# Patient Record
Sex: Female | Born: 1980 | Race: White | Hispanic: No | Marital: Single | Smoking: Never smoker
Health system: Southern US, Community
[De-identification: ages and names within clinical notes are randomized; demographics above are authoritative.]

## PROBLEM LIST (undated history)

## (undated) DIAGNOSIS — F32A Depression, unspecified: Secondary | ICD-10-CM

## (undated) DIAGNOSIS — T4145XA Adverse effect of unspecified anesthetic, initial encounter: Secondary | ICD-10-CM

## (undated) DIAGNOSIS — E785 Hyperlipidemia, unspecified: Secondary | ICD-10-CM

## (undated) DIAGNOSIS — F419 Anxiety disorder, unspecified: Secondary | ICD-10-CM

## (undated) DIAGNOSIS — E039 Hypothyroidism, unspecified: Secondary | ICD-10-CM

## (undated) DIAGNOSIS — T8859XA Other complications of anesthesia, initial encounter: Secondary | ICD-10-CM

## (undated) DIAGNOSIS — F329 Major depressive disorder, single episode, unspecified: Secondary | ICD-10-CM

## (undated) HISTORY — PX: KNEE SURGERY: SHX244

## (undated) HISTORY — DX: Hyperlipidemia, unspecified: E78.5

---

## 2001-06-12 ENCOUNTER — Encounter: Payer: Self-pay | Admitting: Internal Medicine

## 2001-06-12 ENCOUNTER — Ambulatory Visit (HOSPITAL_COMMUNITY): Admission: RE | Admit: 2001-06-12 | Discharge: 2001-06-12 | Payer: Self-pay | Admitting: Internal Medicine

## 2001-06-13 ENCOUNTER — Other Ambulatory Visit: Admission: RE | Admit: 2001-06-13 | Discharge: 2001-06-13 | Payer: Self-pay | Admitting: Internal Medicine

## 2015-02-12 ENCOUNTER — Other Ambulatory Visit: Payer: Self-pay | Admitting: General Surgery

## 2015-03-30 ENCOUNTER — Encounter: Payer: BLUE CROSS/BLUE SHIELD | Attending: General Surgery | Admitting: Dietician

## 2015-03-30 ENCOUNTER — Encounter: Payer: Self-pay | Admitting: Dietician

## 2015-03-30 VITALS — Ht 67.0 in | Wt 258.9 lb

## 2015-03-30 DIAGNOSIS — Z713 Dietary counseling and surveillance: Secondary | ICD-10-CM | POA: Insufficient documentation

## 2015-03-30 DIAGNOSIS — E669 Obesity, unspecified: Secondary | ICD-10-CM | POA: Diagnosis not present

## 2015-03-30 DIAGNOSIS — Z6841 Body Mass Index (BMI) 40.0 and over, adult: Secondary | ICD-10-CM | POA: Diagnosis not present

## 2015-03-30 NOTE — Progress Notes (Signed)
  Pre-Op Assessment Visit:  Pre-Operative LAGB Surgery  Medical Nutrition Therapy:  Appt start time: 0800   End time:  0830.  Patient was seen on 03/30/2015 for Pre-Operative Nutrition Assessment. Assessment and [facility] on 03/30/2015.   Preferred Learning Style:   No preference indicated   Learning Readiness:   Ready  Handouts given during visit include:  Pre-Op Goals Bariatric Surgery Protein Shakes   During the appointment today the following Pre-Op Goals were reviewed with the patient: Maintain or lose weight as instructed by your surgeon Make healthy food choices Begin to limit portion sizes Limited concentrated sugars and fried foods Keep fat/sugar in the single digits per serving on   food labels Practice CHEWING your food  (aim for 30 chews per bite or until applesauce consistency) Practice not drinking 15 minutes before, during, and 30 minutes after each meal/snack Avoid all carbonated beverages  Avoid/limit caffeinated beverages  Avoid all sugar-sweetened beverages Consume 3 meals per day; eat every 3-5 hours Make a list of non-food related activities Aim for 64-100 ounces of FLUID daily  Aim for at least 60-80 grams of PROTEIN daily Look for a liquid protein source that contain ?15 g protein and ?5 g carbohydrate  (ex: shakes, drinks, shots)  Patient-Centered Goals: She would like to have to have more energy, improve sleep, and improve self confidence.  9-10 level of confidence/10 level of importance  Demonstrated degree of understanding via:  Teach Back  Teaching Method Utilized:  Visual Auditory Hands on  Barriers to learning/adherence to lifestyle change: none  Patient to call the [facility] to enroll in Pre-Op and Post-Op Nutrition Education when surgery date is scheduled.

## 2015-03-30 NOTE — Patient Instructions (Signed)

## 2015-05-22 ENCOUNTER — Ambulatory Visit: Payer: Self-pay | Admitting: Dietician

## 2015-08-02 NOTE — Progress Notes (Signed)
Please put orders in Epic surgery 08-17-15 pre op 08-10-15 Thanks

## 2015-08-05 NOTE — Patient Instructions (Signed)
Cynthia Webb  08/05/2015   Your procedure is scheduled on:   08/17/2015    Report to [facility]  Entrance take [place]  elevators to 3rd floor to  Short Stay Center at    0800 AM.  Call this number if you have problems the morning of surgery [phone]   Remember: ONLY 1 PERSON MAY GO WITH YOU TO SHORT STAY TO GET  READY MORNING OF YOUR SURGERY.  Do not eat food or drink liquids :After Midnight.     Take these medicines the morning of surgery with A SIP OF WATER: Thyroid ( Armour) DO NOT TAKE ANY DIABETIC MEDICATIONS DAY OF YOUR SURGERY                               You may not have any metal on your body including hair pins and              piercings  Do not wear jewelry, make-up, lotions, powders or perfumes, deodorant             Do not wear nail polish.  Do not shave  48 hours prior to surgery.                Do not bring valuables to the hospital. [facility] IS NOT             RESPONSIBLE   FOR VALUABLES.  Contacts, dentures or bridgework may not be worn into surgery.  Leave suitcase in the car. After surgery it may be brought to your room.       Special Instructions: coughing and deep breathing exercises, leg exercises               Please read over the following fact sheets you were given: _____________________________________________________________________             [facility] - Preparing for Surgery Before surgery, you can play an important role.  Because skin is not sterile, your skin needs to be as free of germs as possible.  You can reduce the number of germs on your skin by washing with CHG (chlorahexidine gluconate) soap before surgery.  CHG is an antiseptic cleaner which kills germs and bonds with the skin to continue killing germs even after washing. Please DO NOT use if you have an allergy to CHG or antibacterial soaps.  If your skin becomes reddened/irritated stop using the CHG and inform your nurse when you arrive at Short  Stay. Do not shave (including legs and underarms) for at least 48 hours prior to the first CHG shower.  You may shave your face/neck. Please follow these instructions carefully:  1.  Shower with CHG Soap the night before surgery and the  morning of Surgery.  2.  If you choose to wash your hair, wash your hair first as usual with your  normal  shampoo.  3.  After you shampoo, rinse your hair and body thoroughly to remove the  shampoo.                           4.  Use CHG as you would any other liquid soap.  You can apply chg directly  to the skin and wash  Gently with a scrungie or clean washcloth.  5.  Apply the CHG Soap to your body ONLY FROM THE NECK DOWN.   Do not use on face/ open                           Wound or open sores. Avoid contact with eyes, ears mouth and genitals (private parts).                       Wash face,  Genitals (private parts) with your normal soap.             6.  Wash thoroughly, paying special attention to the area where your surgery  will be performed.  7.  Thoroughly rinse your body with warm water from the neck down.  8.  DO NOT shower/wash with your normal soap after using and rinsing off  the CHG Soap.                9.  Pat yourself dry with a clean towel.            10.  Wear clean pajamas.            11.  Place clean sheets on your bed the night of your first shower and do not  sleep with pets. Day of Surgery : Do not apply any lotions/deodorants the morning of surgery.  Please wear clean clothes to the hospital/surgery center.  FAILURE TO FOLLOW THESE INSTRUCTIONS MAY RESULT IN THE CANCELLATION OF YOUR SURGERY PATIENT SIGNATURE_________________________________  NURSE SIGNATURE__________________________________  ________________________________________________________________________

## 2015-08-06 ENCOUNTER — Ambulatory Visit: Payer: Self-pay | Admitting: General Surgery

## 2015-08-09 ENCOUNTER — Encounter: Payer: BLUE CROSS/BLUE SHIELD | Attending: General Surgery

## 2015-08-09 DIAGNOSIS — Z713 Dietary counseling and surveillance: Secondary | ICD-10-CM | POA: Diagnosis not present

## 2015-08-09 DIAGNOSIS — Z6837 Body mass index (BMI) 37.0-37.9, adult: Secondary | ICD-10-CM | POA: Diagnosis not present

## 2015-08-10 ENCOUNTER — Encounter (HOSPITAL_COMMUNITY): Payer: Self-pay

## 2015-08-10 ENCOUNTER — Encounter (HOSPITAL_COMMUNITY)
Admission: RE | Admit: 2015-08-10 | Discharge: 2015-08-10 | Disposition: A | Discharge status: Home / Self Care | Payer: BLUE CROSS/BLUE SHIELD | Source: Ambulatory Visit | Attending: General Surgery | Admitting: General Surgery

## 2015-08-10 DIAGNOSIS — Z01818 Encounter for other preprocedural examination: Secondary | ICD-10-CM | POA: Insufficient documentation

## 2015-08-10 HISTORY — DX: Other complications of anesthesia, initial encounter: T88.59XA

## 2015-08-10 HISTORY — DX: Major depressive disorder, single episode, unspecified: F32.9

## 2015-08-10 HISTORY — DX: Hypothyroidism, unspecified: E03.9

## 2015-08-10 HISTORY — DX: Depression, unspecified: F32.A

## 2015-08-10 HISTORY — DX: Adverse effect of unspecified anesthetic, initial encounter: T41.45XA

## 2015-08-10 HISTORY — DX: Anxiety disorder, unspecified: F41.9

## 2015-08-10 LAB — COMPREHENSIVE METABOLIC PANEL
ALBUMIN: 3.8 g/dL (ref 3.5–5.0)
ALK PHOS: 67 U/L (ref 38–126)
ALT: 25 U/L (ref 14–54)
ANION GAP: 8 (ref 5–15)
AST: 21 U/L (ref 15–41)
BUN: 15 mg/dL (ref 6–20)
CHLORIDE: 107 mmol/L (ref 101–111)
CO2: 22 mmol/L (ref 22–32)
Calcium: 8.7 mg/dL — ABNORMAL LOW (ref 8.9–10.3)
Creatinine, Ser: 0.73 mg/dL (ref 0.44–1.00)
GFR calc non Af Amer: >60 mL/min (ref >60)
GLUCOSE: 83 mg/dL (ref 65–99)
POTASSIUM: 3.9 mmol/L (ref 3.5–5.1)
SODIUM: 137 mmol/L (ref 135–145)
Total Bilirubin: 0.4 mg/dL (ref 0.3–1.2)
Total Protein: 7.2 g/dL (ref 6.5–8.1)

## 2015-08-10 LAB — CBC WITH DIFFERENTIAL/PLATELET
BASOS PCT: 0 %
Basophils Absolute: 0 10*3/uL (ref 0.0–0.1)
EOS ABS: 0.1 10*3/uL (ref 0.0–0.7)
EOS PCT: 1 %
HCT: 40.6 % (ref 36.0–46.0)
HEMOGLOBIN: 13.2 g/dL (ref 12.0–15.0)
LYMPHS ABS: 3.2 10*3/uL (ref 0.7–4.0)
Lymphocytes Relative: 31 %
MCH: 29.6 pg (ref 26.0–34.0)
MCHC: 32.5 g/dL (ref 30.0–36.0)
MCV: 91 fL (ref 78.0–100.0)
MONOS PCT: 6 %
Monocytes Absolute: 0.6 10*3/uL (ref 0.1–1.0)
NEUTROS PCT: 62 %
Neutro Abs: 6.3 10*3/uL (ref 1.7–7.7)
PLATELETS: 300 10*3/uL (ref 150–400)
RBC: 4.46 MIL/uL (ref 3.87–5.11)
RDW: 14.4 % (ref 11.5–15.5)
WBC: 10.3 10*3/uL (ref 4.0–10.5)

## 2015-08-10 NOTE — Progress Notes (Signed)
03/30/15 EKG on chart

## 2015-08-11 DIAGNOSIS — L719 Rosacea, unspecified: Secondary | ICD-10-CM | POA: Insufficient documentation

## 2015-08-11 DIAGNOSIS — K219 Gastro-esophageal reflux disease without esophagitis: Secondary | ICD-10-CM

## 2015-08-11 DIAGNOSIS — I10 Essential (primary) hypertension: Secondary | ICD-10-CM | POA: Insufficient documentation

## 2015-08-11 DIAGNOSIS — J309 Allergic rhinitis, unspecified: Secondary | ICD-10-CM | POA: Insufficient documentation

## 2015-08-11 NOTE — Progress Notes (Signed)
  Pre-Operative Nutrition Class:  Appt start time: 3220   End time:  1830.  Patient was seen on 08/09/15 for Pre-Operative Bariatric Surgery Education at [facility].   Surgery date: 08/17/15 Surgery type: LAGB Start weight at [facility]: 259 lbs on 03/30/15 Weight today: 252.4 lbs  TANITA  BODY COMP RESULTS  08/09/15   BMI (kg/m^2) N/A   Fat Mass (lbs)    Fat Free Mass (lbs)    Total Body Water (lbs)     The following the learning objectives were met by the patient during this course:  Identify Pre-Op Dietary Goals and will begin 2 weeks pre-operatively  Identify appropriate sources of fluids and proteins   State protein recommendations and appropriate sources pre and post-operatively  Identify Post-Operative Dietary Goals and will follow for 2 weeks post-operatively  Identify appropriate multivitamin and calcium sources  Describe the need for physical activity post-operatively and will follow MD recommendations  State when to call healthcare provider regarding medication questions or post-operative complications  Handouts given during class include:  Pre-Op Bariatric Surgery Diet Handout  Protein Shake Handout  Post-Op Bariatric Surgery Nutrition Handout  [facility]EL[facility] Program Information Flyer  Support Group Information Flyer  [facility] Bariatric Supplements Price List  Follow-Up Plan: Patient will follow-up at [facility] 2 weeks post operatively for diet advancement per MD.

## 2015-08-12 ENCOUNTER — Ambulatory Visit: Payer: Self-pay | Admitting: General Surgery

## 2015-08-12 NOTE — H&P (Signed)
Cynthia Webb 08/12/2015 11:40 AM Location: [facility] Patient #: [id number] DOB: 11/03/1981 Single / Language: English / Race: White Female History of Present Illness (Eric M. Wilson MD; 08/12/2015 1:44 PM) The patient is a 34 year old female who presents for a preoperative evaluation. She comes in today for her preoperative visit. She has been scheduled and approved for laparoscopic adjustable gastric band surgery for October 4. I initially met her on April 1. Her weight at that time was 260 pounds. She denies any changes since she was last seen. She denies any trips to the emergency room or hospital. She denies any new medical diagnoses. She denies any chest pain, chest pressure, source of breath, dyspnea on exertion, orthopnea or paroxysmal nocturnal dyspnea. Her ultrasound, chest x-ray, upper GI were all normal. Her H. pylori breath test was within normal limits. The majority of her blood work was within acceptable limits however her lipid panel was elevated. Her total cholesterol level was 264, triglyceride level was 132, LDL level was 205, HDL level 33. Hemoglobin A1c was 5.5, vitamin D level was low at 28. 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  Diagnostic Studies History (Eric M Wilson, MD; 08/12/2015 1:46 PM) Colonoscopy never Mammogram 1-3 years ago Pap Smear 1-5 years ago  Allergies (Ashley Beck, CMA; 08/12/2015 11:41 AM) No Known Drug Allergies 02/12/2015  Medication History (Eric M Wilson, MD; 08/12/2015 1:46 PM) Armour  Thyroid (90MG Tablet, Oral) Active. Omeprazole (10MG Capsule DR, Oral) Active. Nasonex (50MCG/ACT Suspension, Nasal) Active. Medications Reconciled Ambien (5MG Tablet, 1 (one) Tablet Oral at bedtime, as needed, Taken starting 08/12/2015) Active. Zofran ODT (4MG Tablet Disperse, 1 (one) Tablet Disperse Oral every six hours, as needed, Taken starting 08/12/2015) Active. OxyCODONE HCl (5MG/5ML Solution, 5-10 Milliliter Oral every four hours, as needed, Taken starting 08/12/2015) Active.  Social History (Eric M Wilson, MD; 08/12/2015 1:46 PM) No drug use Tobacco use Never smoker. Alcohol use Occasional alcohol use. No caffeine use  Family History (Eric M Wilson, MD; 08/12/2015 1:46 PM) Depression Father, Mother. Heart disease in female family member before age 55 Hypertension Father, Mother. Melanoma Father. Thyroid problems Mother.  Pregnancy / Birth History (Eric M Wilson, MD; 08/12/2015 1:46 PM) Gravida 3 Maternal age 15-20 Para 0 Contraceptive History Intrauterine device. Age at menarche 11 years. Regular periods     Review of Systems (Eric M Wilson, MD; 08/12/2015 1:39 00) General Present- Fatigue. Not Present- Appetite Loss, Chills, Fever, Night Sweats, Weight Gain and Weight Loss. Skin Present- Dryness. Not Present- Change in Wart/Mole, Hives, Jaundice, New Lesions, Non-Healing Wounds, Rash and Ulcer. HEENT Present- Earache, Seasonal Allergies and Wears glasses/contact lenses. Not Present- Hearing Loss, Hoarseness, Nose Bleed, Oral Ulcers, Ringing in the Ears, Sinus Pain, Sore Throat, Visual Disturbances and Yellow Eyes. Respiratory Not Present- Bloody sputum, Chronic Cough, Difficulty Breathing, Snoring and Wheezing. Breast Not Present- Breast Mass, Breast Pain, Nipple Discharge and Skin Changes. Cardiovascular Not Present- Chest Pain, Difficulty Breathing Lying Down, Leg Cramps, Palpitations, Rapid Heart Rate, Shortness of Breath and Swelling of  Extremities. Gastrointestinal Present- Bloating. Not Present- Abdominal Pain, Bloody Stool, Change in Bowel Habits, Chronic diarrhea,   Constipation, Difficulty Swallowing, Excessive gas, Gets full quickly at meals, Hemorrhoids, Indigestion, Nausea, Rectal Pain and Vomiting. Female Genitourinary Not Present- Frequency, Nocturia, Painful Urination, Pelvic Pain and Urgency. Musculoskeletal Present- Back Pain, Joint Stiffness, Muscle Pain, Muscle Weakness and Swelling of Extremities. Not Present- Joint Pain. Neurological Present- Headaches. Not Present- Decreased Memory, Fainting, Numbness, Seizures, Tingling, Tremor, Trouble walking and Weakness. Psychiatric Present- Anxiety and Depression. Not Present- Bipolar, Change in Sleep Pattern, Fearful and Frequent crying. Endocrine Not Present- Cold Intolerance, Excessive Hunger, Hair Changes, Heat Intolerance, Hot flashes and New Diabetes. Hematology Not Present- Easy Bruising, Excessive bleeding, Gland problems, HIV and Persistent Infections.  Vitals (Ashley Beck CMA; 08/12/2015 11:41 AM) 08/12/2015 11:41 AM Weight: 251.4 lb Height: 67in Body Surface Area: 2.32 m Body Mass Index: 39.37 kg/m Temp.: 98.1F(Temporal)  Pulse: 81 (Regular)  BP: 128/82 (Sitting, Left Arm, Standard)     Physical Exam (Eric M. Wilson MD; 08/12/2015 1:38 PM)  General Mental Status-Alert. General Appearance-Consistent with stated age. Hydration-Well hydrated. Voice-Normal. Note: morbidly obese   Head and Neck Head-normocephalic, atraumatic with no lesions or palpable masses. Trachea-midline. Thyroid Gland Characteristics - normal size and consistency.  Eye Eyeball - Bilateral-Extraocular movements intact. Sclera/Conjunctiva - Bilateral-No scleral icterus.  Chest and Lung Exam Chest and lung exam reveals -quiet, even and easy respiratory effort with no use of accessory muscles and on auscultation, normal breath sounds, no  adventitious sounds and normal vocal resonance. Inspection Chest Wall - Normal. Back - normal.  Breast - Did not examine.  Cardiovascular Cardiovascular examination reveals -normal heart sounds, regular rate and rhythm with no murmurs and normal pedal pulses bilaterally.  Abdomen Inspection Inspection of the abdomen reveals - No Hernias. Skin - Scar - no surgical scars. Palpation/Percussion Palpation and Percussion of the abdomen reveal - Soft, Non Tender, No Rebound tenderness, No Rigidity (guarding) and No hepatosplenomegaly. Auscultation Auscultation of the abdomen reveals - Bowel sounds normal.  Peripheral Vascular Upper Extremity Palpation - Pulses bilaterally normal.  Neurologic Neurologic evaluation reveals -alert and oriented x 3 with no impairment of recent or remote memory. Mental Status-Normal.  Neuropsychiatric The patient's mood and affect are described as -normal. Judgment and Insight-insight is appropriate concerning matters relevant to self.  Musculoskeletal Normal Exam - Left-Upper Extremity Strength Normal and Lower Extremity Strength Normal. Normal Exam - Right-Upper Extremity Strength Normal and Lower Extremity Strength Normal.  Lymphatic Head & Neck  General Head & Neck Lymphatics: Bilateral - Description - Normal. Axillary - Did not examine. Femoral & Inguinal - Did not examine.    Assessment & Plan (Eric M. Wilson MD; 08/12/2015 1:46 PM)  BODY MASS INDEX (BMI) OF 40.0 TO 49.9 (E66.01) Impression: We reviewed her preoperative workup. We discussed the preoperative diet. We discussed the typical postoperative course including the diet progression after surgery. We discussed the typical follow-up schedule. We discussed the typical weight loss seen after lap band surgery. We discussed the expected eating tips and techniques she will need to develop in order to avoid complications after surgery.  Current Plans Pt Education -  EMW_preopbariatric Started Zofran ODT 4MG, 1 (one) Tablet Disperse every six hours, as needed, #20, 08/12/2015, No Refill. Started OxyCODONE HCl 5MG/5ML, 5-10 Milliliter every four hours, as needed, 120 Milliliter, 08/12/2015, No Refill. 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 She normally takes melatonin however anesthesia asked her to   stop taking that preoperatively. She inquires something to help her sleep at night for the next several days. She states that Tylenol and Advil PM do not help. I gave her a few tablets of Ambien.  Current Plans Started Ambien 5MG, 1 (one) Tablet at bedtime, as needed, #5, 08/12/2015, No Refill. VITAMIN D DEFICIENCY (E55.9) Impression: She knew she had a vitamin D deficiency. She states that she is taking a daily supplement. We will readdress this after surgery. She may have to go on a short course of 50,000 international units once per week for about 8 weeks  Eric M. Wilson, MD, FACS General, Bariatric, & Minimally Invasive Surgery [facility]  

## 2015-08-17 ENCOUNTER — Encounter (HOSPITAL_COMMUNITY): Payer: Self-pay | Admitting: *Deleted

## 2015-08-17 ENCOUNTER — Ambulatory Visit (HOSPITAL_COMMUNITY)
Admission: RE | Admit: 2015-08-17 | Discharge: 2015-08-17 | Disposition: A | Discharge status: Home / Self Care | Payer: BLUE CROSS/BLUE SHIELD | Source: Ambulatory Visit | Attending: General Surgery | Admitting: General Surgery

## 2015-08-17 ENCOUNTER — Encounter (HOSPITAL_COMMUNITY)
Admission: RE | Disposition: A | Discharge status: Home / Self Care | Payer: Self-pay | Source: Ambulatory Visit | Attending: General Surgery

## 2015-08-17 ENCOUNTER — Ambulatory Visit (HOSPITAL_COMMUNITY): Payer: BLUE CROSS/BLUE SHIELD | Admitting: Anesthesiology

## 2015-08-17 ENCOUNTER — Ambulatory Visit (HOSPITAL_COMMUNITY): Payer: BLUE CROSS/BLUE SHIELD

## 2015-08-17 DIAGNOSIS — E063 Autoimmune thyroiditis: Secondary | ICD-10-CM | POA: Diagnosis not present

## 2015-08-17 DIAGNOSIS — G478 Other sleep disorders: Secondary | ICD-10-CM | POA: Insufficient documentation

## 2015-08-17 DIAGNOSIS — E039 Hypothyroidism, unspecified: Secondary | ICD-10-CM | POA: Insufficient documentation

## 2015-08-17 DIAGNOSIS — E781 Pure hyperglyceridemia: Secondary | ICD-10-CM | POA: Diagnosis not present

## 2015-08-17 DIAGNOSIS — Z6837 Body mass index (BMI) 37.0-37.9, adult: Secondary | ICD-10-CM | POA: Insufficient documentation

## 2015-08-17 DIAGNOSIS — F419 Anxiety disorder, unspecified: Secondary | ICD-10-CM | POA: Insufficient documentation

## 2015-08-17 DIAGNOSIS — F329 Major depressive disorder, single episode, unspecified: Secondary | ICD-10-CM | POA: Diagnosis not present

## 2015-08-17 DIAGNOSIS — E7800 Pure hypercholesterolemia, unspecified: Secondary | ICD-10-CM | POA: Diagnosis not present

## 2015-08-17 DIAGNOSIS — K449 Diaphragmatic hernia without obstruction or gangrene: Secondary | ICD-10-CM | POA: Diagnosis not present

## 2015-08-17 DIAGNOSIS — I10 Essential (primary) hypertension: Secondary | ICD-10-CM | POA: Diagnosis not present

## 2015-08-17 DIAGNOSIS — E559 Vitamin D deficiency, unspecified: Secondary | ICD-10-CM | POA: Insufficient documentation

## 2015-08-17 DIAGNOSIS — K219 Gastro-esophageal reflux disease without esophagitis: Secondary | ICD-10-CM | POA: Diagnosis not present

## 2015-08-17 DIAGNOSIS — R110 Nausea: Secondary | ICD-10-CM

## 2015-08-17 HISTORY — PX: LAPAROSCOPIC GASTRIC BANDING WITH HIATAL HERNIA REPAIR: SHX6351

## 2015-08-17 LAB — PREGNANCY, URINE: Preg Test, Ur: NEGATIVE

## 2015-08-17 SURGERY — GASTRIC BANDING, LAPAROSCOPIC, WITH HIATAL HERNIA REPAIR
Anesthesia: General

## 2015-08-17 MED ORDER — HYDROMORPHONE HCL 1 MG/ML IJ SOLN
0.2500 mg | INTRAMUSCULAR | Status: DC | PRN
  Administered 2015-08-17: 0.25 mg via INTRAVENOUS

## 2015-08-17 MED ORDER — FENTANYL CITRATE (PF) 100 MCG/2ML IJ SOLN
INTRAMUSCULAR | Status: DC | PRN
  Administered 2015-08-17 (×2): 50 ug via INTRAVENOUS
  Administered 2015-08-17: 100 ug via INTRAVENOUS
  Administered 2015-08-17: 50 ug via INTRAVENOUS

## 2015-08-17 MED ORDER — BUPIVACAINE LIPOSOME 1.3 % IJ SUSP
20.0000 mL | Freq: Once | INTRAMUSCULAR | Status: AC
  Administered 2015-08-17: 20 mL
  Filled 2015-08-17: qty 20

## 2015-08-17 MED ORDER — CEFOTETAN DISODIUM-DEXTROSE 2-2.08 GM-% IV SOLR
2.0000 g | INTRAVENOUS | Status: AC
  Administered 2015-08-17: 2 g via INTRAVENOUS

## 2015-08-17 MED ORDER — SODIUM CHLORIDE 0.9 % IJ SOLN
INTRAMUSCULAR | Status: AC
  Filled 2015-08-17: qty 10

## 2015-08-17 MED ORDER — LACTATED RINGERS IV SOLN
INTRAVENOUS | Status: DC
  Administered 2015-08-17: 1000 mL via INTRAVENOUS

## 2015-08-17 MED ORDER — HYDROMORPHONE HCL 1 MG/ML IJ SOLN
INTRAMUSCULAR | Status: DC | PRN
  Administered 2015-08-17 (×3): 0.5 mg via INTRAVENOUS

## 2015-08-17 MED ORDER — DEXAMETHASONE SODIUM PHOSPHATE 10 MG/ML IJ SOLN
INTRAMUSCULAR | Status: DC | PRN
  Administered 2015-08-17: 10 mg via INTRAVENOUS

## 2015-08-17 MED ORDER — FENTANYL CITRATE (PF) 250 MCG/5ML IJ SOLN
INTRAMUSCULAR | Status: AC
  Filled 2015-08-17: qty 25

## 2015-08-17 MED ORDER — HYDROMORPHONE HCL 1 MG/ML IJ SOLN
INTRAMUSCULAR | Status: AC
  Administered 2015-08-17: 0.25 mg via INTRAVENOUS
  Filled 2015-08-17: qty 1

## 2015-08-17 MED ORDER — OXYCODONE HCL 5 MG/5ML PO SOLN
5.0000 mg | ORAL | Status: DC | PRN
Start: 2015-08-17 — End: 2020-02-10

## 2015-08-17 MED ORDER — NEOSTIGMINE METHYLSULFATE 10 MG/10ML IV SOLN
INTRAVENOUS | Status: DC | PRN
  Administered 2015-08-17: 4 mg via INTRAVENOUS

## 2015-08-17 MED ORDER — PREMIER PROTEIN SHAKE
2.0000 [oz_av] | Freq: Four times a day (QID) | ORAL | Status: DC
  Filled 2015-08-17: qty 325.31

## 2015-08-17 MED ORDER — ONDANSETRON HCL 4 MG/2ML IJ SOLN
INTRAMUSCULAR | Status: DC | PRN
  Administered 2015-08-17: 4 mg via INTRAVENOUS

## 2015-08-17 MED ORDER — PROPOFOL 10 MG/ML IV BOLUS
INTRAVENOUS | Status: DC | PRN
  Administered 2015-08-17: 180 mg via INTRAVENOUS

## 2015-08-17 MED ORDER — CHLORHEXIDINE GLUCONATE 4 % EX LIQD: 60.0000 mL | Freq: Once | CUTANEOUS | Status: DC

## 2015-08-17 MED ORDER — PROMETHAZINE HCL 25 MG/ML IJ SOLN
INTRAMUSCULAR | Status: AC
  Administered 2015-08-17: 6.25 mg via INTRAVENOUS
  Filled 2015-08-17: qty 1

## 2015-08-17 MED ORDER — MEPERIDINE HCL 50 MG/ML IJ SOLN: 6.2500 mg | INTRAMUSCULAR | Status: DC | PRN

## 2015-08-17 MED ORDER — KCL IN DEXTROSE-NACL 20-5-0.45 MEQ/L-%-% IV SOLN
INTRAVENOUS | Status: DC
  Administered 2015-08-17: 15:00:00 via INTRAVENOUS
  Filled 2015-08-17: qty 1000

## 2015-08-17 MED ORDER — SODIUM CHLORIDE 0.9 % IJ SOLN
INTRAMUSCULAR | Status: AC
  Filled 2015-08-17: qty 20

## 2015-08-17 MED ORDER — MIDAZOLAM HCL 2 MG/2ML IJ SOLN
INTRAMUSCULAR | Status: AC
  Filled 2015-08-17: qty 4

## 2015-08-17 MED ORDER — SODIUM CHLORIDE 0.9 % IJ SOLN
INTRAMUSCULAR | Status: DC | PRN
  Administered 2015-08-17: 20 mL via INTRAVENOUS

## 2015-08-17 MED ORDER — MORPHINE SULFATE (PF) 10 MG/ML IV SOLN: 2.0000 mg | INTRAVENOUS | Status: DC | PRN

## 2015-08-17 MED ORDER — PROMETHAZINE HCL 25 MG/ML IJ SOLN
INTRAMUSCULAR | Status: AC
  Filled 2015-08-17: qty 1

## 2015-08-17 MED ORDER — GLYCOPYRROLATE 0.2 MG/ML IJ SOLN
INTRAMUSCULAR | Status: DC | PRN
  Administered 2015-08-17: .6 mg via INTRAVENOUS

## 2015-08-17 MED ORDER — KETOROLAC TROMETHAMINE 30 MG/ML IJ SOLN
INTRAMUSCULAR | Status: DC | PRN
  Administered 2015-08-17: 30 mg via INTRAVENOUS

## 2015-08-17 MED ORDER — SODIUM CHLORIDE 0.9 % IJ SOLN
INTRAMUSCULAR | Status: DC | PRN
  Administered 2015-08-17: 50 mL via INTRAVENOUS

## 2015-08-17 MED ORDER — LIDOCAINE HCL (CARDIAC) 20 MG/ML IV SOLN
INTRAVENOUS | Status: DC | PRN
  Administered 2015-08-17: 50 mg via INTRAVENOUS

## 2015-08-17 MED ORDER — ACETAMINOPHEN 160 MG/5ML PO SOLN
325.0000 mg | ORAL | Status: DC | PRN
  Administered 2015-08-17: 650 mg via ORAL
  Filled 2015-08-17: qty 20.3

## 2015-08-17 MED ORDER — CEFOTETAN DISODIUM-DEXTROSE 2-2.08 GM-% IV SOLR
INTRAVENOUS | Status: AC
  Filled 2015-08-17: qty 50

## 2015-08-17 MED ORDER — GLYCOPYRROLATE 0.2 MG/ML IJ SOLN
INTRAMUSCULAR | Status: AC
  Filled 2015-08-17: qty 3

## 2015-08-17 MED ORDER — ROCURONIUM BROMIDE 100 MG/10ML IV SOLN
INTRAVENOUS | Status: DC | PRN
Start: 2015-08-17 — End: 2015-08-17
  Administered 2015-08-17: 10 mg via INTRAVENOUS
  Administered 2015-08-17: 40 mg via INTRAVENOUS
  Administered 2015-08-17 (×2): 10 mg via INTRAVENOUS

## 2015-08-17 MED ORDER — PROPOFOL 10 MG/ML IV BOLUS
INTRAVENOUS | Status: AC
  Filled 2015-08-17: qty 20

## 2015-08-17 MED ORDER — ACETAMINOPHEN 160 MG/5ML PO SOLN: 650.0000 mg | ORAL | Status: DC | PRN

## 2015-08-17 MED ORDER — SODIUM CHLORIDE 0.9 % IJ SOLN
INTRAMUSCULAR | Status: AC
  Filled 2015-08-17: qty 50

## 2015-08-17 MED ORDER — MIDAZOLAM HCL 5 MG/5ML IJ SOLN
INTRAMUSCULAR | Status: DC | PRN
  Administered 2015-08-17: 2 mg via INTRAVENOUS

## 2015-08-17 MED ORDER — LACTATED RINGERS IV SOLN: INTRAVENOUS | Status: DC

## 2015-08-17 MED ORDER — PROMETHAZINE HCL 25 MG/ML IJ SOLN
6.2500 mg | INTRAMUSCULAR | Status: AC | PRN
  Administered 2015-08-17 (×2): 6.25 mg via INTRAVENOUS

## 2015-08-17 MED ORDER — HEPARIN SODIUM (PORCINE) 5000 UNIT/ML IJ SOLN
5000.0000 [IU] | INTRAMUSCULAR | Status: AC
  Administered 2015-08-17: 5000 [IU] via SUBCUTANEOUS
  Filled 2015-08-17: qty 1

## 2015-08-17 MED ORDER — OXYCODONE HCL 5 MG/5ML PO SOLN: 5.0000 mg | ORAL | Status: DC | PRN

## 2015-08-17 MED ORDER — ONDANSETRON HCL 4 MG/2ML IJ SOLN: 4.0000 mg | INTRAMUSCULAR | Status: DC | PRN

## 2015-08-17 MED ORDER — DEXAMETHASONE SODIUM PHOSPHATE 10 MG/ML IJ SOLN
INTRAMUSCULAR | Status: AC
  Filled 2015-08-17: qty 1

## 2015-08-17 MED ORDER — HYDROMORPHONE HCL 2 MG/ML IJ SOLN
INTRAMUSCULAR | Status: AC
  Filled 2015-08-17: qty 1

## 2015-08-17 SURGICAL SUPPLY — 59 items
ADH SKN CLS APL DERMABOND .7 (GAUZE/BANDAGES/DRESSINGS) ×2
APL SKNCLS STERI-STRIP NONHPOA (GAUZE/BANDAGES/DRESSINGS)
BAND LAP VG SYSTEM W/TUBES (Band) ×1 IMPLANT
BENZOIN TINCTURE PRP APPL 2/3 (GAUZE/BANDAGES/DRESSINGS) IMPLANT
BLADE HEX COATED 2.75 (ELECTRODE) ×1 IMPLANT
BLADE SURG 15 STRL LF DISP TIS (BLADE) ×2 IMPLANT
BLADE SURG 15 STRL SS (BLADE) ×4
BLADE SURG SZ11 CARB STEEL (BLADE) ×4 IMPLANT
CHLORAPREP W/TINT 26ML (MISCELLANEOUS) ×7 IMPLANT
COVER SURGICAL LIGHT HANDLE (MISCELLANEOUS) ×4 IMPLANT
DECANTER SPIKE VIAL GLASS SM (MISCELLANEOUS) ×4 IMPLANT
DERMABOND ADVANCED (GAUZE/BANDAGES/DRESSINGS) ×2
DERMABOND ADVANCED .7 DNX12 (GAUZE/BANDAGES/DRESSINGS) ×1 IMPLANT
DEVICE SUT QUICK LOAD TK 5 (STAPLE) ×7 IMPLANT
DEVICE SUT TI-KNOT TK 5X26 (MISCELLANEOUS) ×3 IMPLANT
DEVICE SUTURE ENDOST 10MM (ENDOMECHANICALS) ×3 IMPLANT
DEVICE TI KNOT TK5 (MISCELLANEOUS) ×1
DISSECTOR BLUNT TIP ENDO 5MM (MISCELLANEOUS) IMPLANT
DRAPE CAMERA CLOSED 9X96 (DRAPES) ×4 IMPLANT
DRAPE UTILITY XL STRL (DRAPES) ×8 IMPLANT
ELECT PENCIL ROCKER SW 15FT (MISCELLANEOUS) ×4 IMPLANT
ELECT REM PT RETURN 9FT ADLT (ELECTROSURGICAL) ×4
ELECTRODE REM PT RTRN 9FT ADLT (ELECTROSURGICAL) ×2 IMPLANT
GLOVE BIO SURGEON STRL SZ7.5 (GLOVE) ×4 IMPLANT
GLOVE BIOGEL M STRL SZ7.5 (GLOVE) IMPLANT
GLOVE INDICATOR 8.0 STRL GRN (GLOVE) ×4 IMPLANT
GOWN STRL REUS W/TWL XL LVL3 (GOWN DISPOSABLE) ×12 IMPLANT
HOVERMATT SINGLE USE (MISCELLANEOUS) ×4 IMPLANT
KIT BASIN OR (CUSTOM PROCEDURE TRAY) ×4 IMPLANT
LAP BAND VG SYSTEM W/TUBES (Band) ×4 IMPLANT
LIQUID BAND (GAUZE/BANDAGES/DRESSINGS) IMPLANT
MESH HERNIA 1X4 RECT BARD (Mesh General) ×1 IMPLANT
MESH HERNIA BARD 1X4 (Mesh General) ×2 IMPLANT
NDL SPNL 22GX3.5 QUINCKE BK (NEEDLE) ×1 IMPLANT
NEEDLE SPNL 22GX3.5 QUINCKE BK (NEEDLE) ×4 IMPLANT
NS IRRIG 1000ML POUR BTL (IV SOLUTION) ×4 IMPLANT
PACK UNIVERSAL I (CUSTOM PROCEDURE TRAY) ×4 IMPLANT
QUICK LOAD TK 5 (STAPLE) ×2
SET IRRIG TUBING LAPAROSCOPIC (IRRIGATION / IRRIGATOR) IMPLANT
SHEARS HARMONIC ACE PLUS 36CM (ENDOMECHANICALS) IMPLANT
SLEEVE XCEL OPT CAN 5 100 (ENDOMECHANICALS) ×8 IMPLANT
SOLUTION ANTI FOG 6CC (MISCELLANEOUS) ×4 IMPLANT
SPONGE LAP 18X18 X RAY DECT (DISPOSABLE) ×4 IMPLANT
STAPLER VISISTAT 35W (STAPLE) IMPLANT
SUT ETHIBOND 2 0 SH (SUTURE) ×12
SUT ETHIBOND 2 0 SH 36X2 (SUTURE) ×6 IMPLANT
SUT MNCRL AB 4-0 PS2 18 (SUTURE) ×4 IMPLANT
SUT PROLENE 2 0 CT2 30 (SUTURE) ×4 IMPLANT
SUT SILK 0 (SUTURE) ×4
SUT SILK 0 30XBRD TIE 6 (SUTURE) ×2 IMPLANT
SUT SURGIDAC NAB ES-9 0 48 120 (SUTURE) ×6 IMPLANT
SUT VIC AB 2-0 SH 27 (SUTURE) ×4
SUT VIC AB 2-0 SH 27X BRD (SUTURE) ×2 IMPLANT
SYR 20CC LL (SYRINGE) ×8 IMPLANT
TOWEL OR 17X26 10 PK STRL BLUE (TOWEL DISPOSABLE) ×4 IMPLANT
TROCAR BLADELESS 15MM (ENDOMECHANICALS) ×4 IMPLANT
TROCAR BLADELESS OPT 5 100 (ENDOMECHANICALS) ×4 IMPLANT
TUBE CALIBRATION LAPBAND (TUBING) ×4 IMPLANT
TUBING INSUFFLATION 10FT LAP (TUBING) ×4 IMPLANT

## 2015-08-17 NOTE — Op Note (Signed)
Cynthia Webb [id number] 1981/08/21 08/17/2015  Laparoscopic Adjustable Gastric Band Placement with hiatal hernia repair Operative Note   Pre-operative Diagnosis: Obesity (BMI 37) Hiatal hernia GERD HTN Hypercholesterolemia Hypertriglyceridemia  Post-operative Diagnosis: same  Surgeon: Atilano Ina   Assistants: Luretha Murphy MD FACS  Anesthesia: General endotracheal anesthesia  ASA Class: 3   Anesthesia: General plus 70cc exparel  Indications:  Obesity with unresponsive to medical treatment.  Findings: AP- Standard band; +hiatal hernia   Procedure Details  The patient was seen in the Holding Room. The risks, benefits, complications, treatment options, and expected outcomes were discussed with the patient. The possibilities of reaction to medication, pulmonary aspiration, perforation of viscus, bleeding, recurrent infection, the need for additional procedures, failure to diagnose a condition, and creating a complication requiring transfusion or operation were discussed with the patient. The patient concurred with the proposed plan, giving informed consent.   The patient was taken to Operating Room # 4, at [facility] identified as [name] and the procedure verified as Laparoscopic Adjustable Gastric Band Placement with possible hiatal hernia repair. A Time Out was held and the above information confirmed.  Full general anesthesia was induced with orotracheal intubation.  The patient was prepped and draped in a supine position. Appropriate antibiotics were given intravenously.  A 1cm incision was made 2 fingerbreadths below the left subcostal margin . Opitview technique was used to gain entry to the abdominal cavity. A 5 mm blunt trocar was advanced under direct vision through the abdominal wall with a 0 degree scope. The abdomen was insufflated, the laparoscope introduced.  There were no untoward findings on diagnostic laparoscopy. Trocars were placed  under direct vision in the following fashion: a 5mm trocar in the lateral right upper quadrant, a 15mm trocar in the right upper quadrant, a 5mm trocar in the high epigastrium, and one 5mm trocar slightly above and to the left of the umbilicus. The patient was placed in reverse trendelenburg position.  The [facility] liver retractor was then placed through the high epigastric trocar site and positioned to hold the liver.   A calibration tube was passed down the oropharynx and into the stomach. 10cc of air was inflated into the balloon and then the calibration tube was gently pulled back toward the GE junction. There was no resistance at the GE junciton/hiatus. It slid back into the esophagus. This confirmed a hiatal hernia. Her preop UGI also showed a small hiatal hernia. There was no dimple.   The gastrohepatic ligament was incised with harmonic scalpel. The right crus was identified. We identified the crossing fat along the right crus. The adipose tissue just above this area was incised with hook cautery. I then bluntly dissected out this area and identified the left crus. There was evidence of a hiatal hernia. I then mobilized the esophagus. The left and right crus were further mobilized with blunt dissection. I was then able to reapproximate the left and right crus with 0 Ethibond using an Endostitch suture device and securing it with a titanium tyknot. I placed a second suture in a similar fashion. We then had the CRNA readvanced the calibration tubing back into the stomach. 10 mL of air was insufflated into the calibration tube balloon. The calibration tube was then gently pulled back and there was resistance at the GE junction. The tube did not slide back up into the esophagus.   The angle of His was identified and the left crus was dissected free.  Approximately 8cm below the angle  of His on the lesser curvature, passing through the pars flaccida and preserving the vagus nerve, a blunt instrument was  gently passed anterior to the right crus and behind the gastro-esophageal junction without difficulty. Care was taken to minimize posterior dissection in order to prevent a posterior slip.   A AP-Standard Lap Band had been introduced into the abdominal cavity through the 15mm trocar and carried around the gastro-esophageal junction and locked onto itself. Three interrupted 2.0 Ethibond sutures (each secured with a titanium tie knot) were used to imbricate the anterior stomach to itself over the band to prevent anterior slippage.   The bowel was examined and there were no obvious lesions. Hemostasis was verified. 70 cc exparel was then infiltrated around all trocar sites in the preperitoneal space. The liver retractor was removed under direct visualization. There was no evidence of liver injury. The tubing from the band was brought out via the right upper quadrant 15mm trocar site. All trocars were then removed under direct vision. The skin incision was lengthened and a subcutaneous space was made to accommodate the port. A 1 inch square of vicryl mesh was anchored to the base of the port with 4 sutures. The port was attached to the tubing and then placed in the subcutaneous pocket.  The redundant tubing was advanced back into the abdominal cavity. Inverted interrupted deep dermal sutures using a 2-0 vicryl were placed.   The wounds were heavily irrigated. The skin incisions were closed with 4-0 monocryl. Dermabond was applied.   Instrument, sponge, and needle counts were correct prior to wound closure and at the conclusion of the case.          Complications:  None; patient tolerated the procedure well.                Condition: stable  [name]. Andrey Campanile, MD, FACS General, Bariatric, & Minimally Invasive Surgery [facility] [place]

## 2015-08-17 NOTE — H&P (View-Only) (Signed)
Cynthia Webb 08/12/2015 11:40 AM Location: [facility] Patient #: [id number] DOB: 06-08-1981 Single / Language: Cleophus Molt / Race: White Female History of Present Illness Randall Hiss M. Wilson MD; 08/12/2015 1:44 PM) The patient is a 34 year old female who presents for a preoperative evaluation. She comes in today for her preoperative visit. She has been scheduled and approved for laparoscopic adjustable gastric band surgery for October 4. I initially met her on April 1. Her weight at that time was 260 pounds. She denies any changes since she was last seen. She denies any trips to the emergency room or hospital. She denies any new medical diagnoses. She denies any chest pain, chest pressure, source of breath, dyspnea on exertion, orthopnea or paroxysmal nocturnal dyspnea. Her ultrasound, chest x-ray, upper GI were all normal. Her H. pylori breath test was within normal limits. The majority of her blood work was within acceptable limits however her lipid panel was elevated. Her total cholesterol level was 264, triglyceride level was 132, LDL level was 205, HDL level 33. Hemoglobin A1c was 5.5, vitamin D level was low at 28. 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  Diagnostic Studies History Gayland Curry, MD; 08/12/2015 1:46 PM) Colonoscopy never Mammogram 1-3 years ago Pap Smear 1-5 years ago  Allergies Elbert Ewings, CMA; 08/12/2015 11:41 AM) No Known Drug Allergies 02/12/2015  Medication History Gayland Curry, MD; 08/12/2015 1:46 PM) Armour  Thyroid (90MG Tablet, Oral) Active. Omeprazole (10MG Capsule DR, Oral) Active. Nasonex (50MCG/ACT Suspension, Nasal) Active. Medications Reconciled Ambien (5MG Tablet, 1 (one) Tablet Oral at bedtime, as needed, Taken starting 08/12/2015) Active. Zofran ODT (4MG Tablet Disperse, 1 (one) Tablet Disperse Oral every six hours, as needed, Taken starting 08/12/2015) Active. OxyCODONE HCl (5MG/5ML Solution, 5-10 Milliliter Oral every four hours, as needed, Taken starting 08/12/2015) Active.  Social History Gayland Curry, MD; 08/12/2015 1:46 PM) No drug use Tobacco use Never smoker. Alcohol use Occasional alcohol use. No caffeine use  Family History Gayland Curry, MD; 08/12/2015 1:46 PM) Depression Father, Mother. Heart disease in female family member before age 29 Hypertension Father, Mother. Melanoma Father. Thyroid problems Mother.  Pregnancy / Birth History Gayland Curry, MD; 08/12/2015 1:46 PM) Durenda Age 3 Maternal age 1-20 Para 0 Contraceptive History Intrauterine device. Age at menarche 69 years. Regular periods     Review of Systems Gayland Curry, MD; 08/12/2015 1:39 00) General Present- Fatigue. Not Present- Appetite Loss, Chills, Fever, Night Sweats, Weight Gain and Weight Loss. Skin Present- Dryness. Not Present- Change in Wart/Mole, Hives, Jaundice, New Lesions, Non-Healing Wounds, Rash and Ulcer. HEENT Present- Earache, Seasonal Allergies and Wears glasses/contact lenses. Not Present- Hearing Loss, Hoarseness, Nose Bleed, Oral Ulcers, Ringing in the Ears, Sinus Pain, Sore Throat, Visual Disturbances and Yellow Eyes. Respiratory Not Present- Bloody sputum, Chronic Cough, Difficulty Breathing, Snoring and Wheezing. Breast Not Present- Breast Mass, Breast Pain, Nipple Discharge and Skin Changes. Cardiovascular Not Present- Chest Pain, Difficulty Breathing Lying Down, Leg Cramps, Palpitations, Rapid Heart Rate, Shortness of Breath and Swelling of  Extremities. Gastrointestinal Present- Bloating. Not Present- Abdominal Pain, Bloody Stool, Change in Bowel Habits, Chronic diarrhea,  Constipation, Difficulty Swallowing, Excessive gas, Gets full quickly at meals, Hemorrhoids, Indigestion, Nausea, Rectal Pain and Vomiting. Female Genitourinary Not Present- Frequency, Nocturia, Painful Urination, Pelvic Pain and Urgency. Musculoskeletal Present- Back Pain, Joint Stiffness, Muscle Pain, Muscle Weakness and Swelling of Extremities. Not Present- Joint Pain. Neurological Present- Headaches. Not Present- Decreased Memory, Fainting, Numbness, Seizures, Tingling, Tremor, Trouble walking and Weakness. Psychiatric Present- Anxiety and Depression. Not Present- Bipolar, Change in Sleep Pattern, Fearful and Frequent crying. Endocrine Not Present- Cold Intolerance, Excessive Hunger, Hair Changes, Heat Intolerance, Hot flashes and New Diabetes. Hematology Not Present- Easy Bruising, Excessive bleeding, Gland problems, HIV and Persistent Infections.  Vitals Elbert Ewings CMA; 08/12/2015 11:41 AM) 08/12/2015 11:41 AM Weight: 251.4 lb Height: 67in Body Surface Area: 2.32 m Body Mass Index: 39.37 kg/m Temp.: 98.31F(Temporal)  Pulse: 81 (Regular)  BP: 128/82 (Sitting, Left Arm, Standard)     Physical Exam Randall Hiss M. Wilson MD; 08/12/2015 1:38 PM)  General Mental Status-Alert. General Appearance-Consistent with stated age. Hydration-Well hydrated. Voice-Normal. Note: morbidly obese   Head and Neck Head-normocephalic, atraumatic with no lesions or palpable masses. Trachea-midline. Thyroid Gland Characteristics - normal size and consistency.  Eye Eyeball - Bilateral-Extraocular movements intact. Sclera/Conjunctiva - Bilateral-No scleral icterus.  Chest and Lung Exam Chest and lung exam reveals -quiet, even and easy respiratory effort with no use of accessory muscles and on auscultation, normal breath sounds, no  adventitious sounds and normal vocal resonance. Inspection Chest Wall - Normal. Back - normal.  Breast - Did not examine.  Cardiovascular Cardiovascular examination reveals -normal heart sounds, regular rate and rhythm with no murmurs and normal pedal pulses bilaterally.  Abdomen Inspection Inspection of the abdomen reveals - No Hernias. Skin - Scar - no surgical scars. Palpation/Percussion Palpation and Percussion of the abdomen reveal - Soft, Non Tender, No Rebound tenderness, No Rigidity (guarding) and No hepatosplenomegaly. Auscultation Auscultation of the abdomen reveals - Bowel sounds normal.  Peripheral Vascular Upper Extremity Palpation - Pulses bilaterally normal.  Neurologic Neurologic evaluation reveals -alert and oriented x 3 with no impairment of recent or remote memory. Mental Status-Normal.  Neuropsychiatric The patient's mood and affect are described as -normal. Judgment and Insight-insight is appropriate concerning matters relevant to self.  Musculoskeletal Normal Exam - Left-Upper Extremity Strength Normal and Lower Extremity Strength Normal. Normal Exam - Right-Upper Extremity Strength Normal and Lower Extremity Strength Normal.  Lymphatic Head & Neck  General Head & Neck Lymphatics: Bilateral - Description - Normal. Axillary - Did not examine. Femoral & Inguinal - Did not examine.    Assessment & Plan Randall Hiss M. Wilson MD; 08/12/2015 1:46 PM)  BODY MASS INDEX (BMI) OF 40.0 TO 49.9 (E66.01) Impression: We reviewed her preoperative workup. We discussed the preoperative diet. We discussed the typical postoperative course including the diet progression after surgery. We discussed the typical follow-up schedule. We discussed the typical weight loss seen after lap band surgery. We discussed the expected eating tips and techniques she will need to develop in order to avoid complications after surgery.  Current Plans Pt Education -  EMW_preopbariatric Started Zofran ODT 4MG, 1 (one) Tablet Disperse every six hours, as needed, #20, 08/12/2015, No Refill. Started OxyCODONE HCl 5MG/5ML, 5-10 Milliliter every four hours, as needed, 120 Milliliter, 08/12/2015, No Refill. 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 She normally takes melatonin however anesthesia asked her to  stop taking that preoperatively. She inquires something to help her sleep at night for the next several days. She states that Tylenol and Advil PM do not help. I gave her a few tablets of Ambien.  Current Plans Started Ambien 5MG, 1 (one) Tablet at bedtime, as needed, #5, 08/12/2015, No Refill. VITAMIN D DEFICIENCY (E55.9) Impression: She knew she had a vitamin D deficiency. She states that she is taking a daily supplement. We will readdress this after surgery. She may have to go on a short course of 50,000 international units once per week for about 8 weeks  Eric M. Redmond Pulling, MD, FACS General, Bariatric, & Minimally Invasive Surgery [facility], [place]

## 2015-08-17 NOTE — Interval H&P Note (Signed)
History and Physical Interval Note:  08/17/2015 9:56 AM  Cynthia Webb  has presented today for surgery, with the diagnosis of Morbid Obesity  The various methods of treatment have been discussed with the patient and family. After consideration of risks, benefits and other options for treatment, the patient has consented to  Procedure(s): LAPAROSCOPIC GASTRIC BANDING (N/A) with possible hiatal hernia as a surgical intervention .  The patient's history has been reviewed, patient examined, no change in status, stable for surgery.  I have reviewed the patient's chart and labs.  Questions were answered to the patient's satisfaction.    [name]. Andrey Campanile, MD, FACS General, Bariatric, & Minimally Invasive Surgery [facility], [place]   [facility]

## 2015-08-17 NOTE — Transfer of Care (Signed)
Immediate Anesthesia Transfer of Care Note  Patient: Cynthia Webb  Procedure(s) Performed: Procedure(s): LAPAROSCOPIC GASTRIC BANDING WITH HIATAL HERNIA REPAIR  Patient Location: PACU  Anesthesia Type:General  Level of Consciousness:  sedated, patient cooperative and responds to stimulation  Airway & Oxygen Therapy:Patient Spontanous Breathing and Patient connected to face mask oxgen  Post-op Assessment:  Report given to PACU RN and Post -op Vital signs reviewed and stable  Post vital signs:  Reviewed and stable  Last Vitals:  Filed Vitals:   08/17/15 1151  BP: 135/74  Pulse: 109  Temp: 37 C  Resp: 18    Complications: No apparent anesthesia complications

## 2015-08-17 NOTE — Anesthesia Preprocedure Evaluation (Addendum)
Anesthesia Evaluation  Patient identified by MRN, date of birth, ID band Patient awake    Reviewed: Allergy & Precautions, NPO status , Patient's Chart, lab work & pertinent test results  Airway Mallampati: III  TM Distance: >3 FB Neck ROM: Full    Dental  (+) Teeth Intact   Pulmonary    breath sounds clear to auscultation       Cardiovascular hypertension,  Rhythm:Regular Rate:Normal     Neuro/Psych PSYCHIATRIC DISORDERS Anxiety Depression    GI/Hepatic Neg liver ROS, GERD  Medicated,  Endo/Other  Hypothyroidism   Renal/GU negative Renal ROS  negative genitourinary   Musculoskeletal negative musculoskeletal ROS (+)   Abdominal   Peds negative pediatric ROS (+)  Hematology negative hematology ROS (+)   Anesthesia Other Findings   Reproductive/Obstetrics negative OB ROS                            Lab Results  Component Value Date   WBC 10.3 08/10/2015   HGB 13.2 08/10/2015   HCT 40.6 08/10/2015   MCV 91.0 08/10/2015   PLT 300 08/10/2015   Lab Results  Component Value Date   CREATININE 0.73 08/10/2015   BUN 15 08/10/2015   NA 137 08/10/2015   K 3.9 08/10/2015   CL 107 08/10/2015   CO2 22 08/10/2015     Anesthesia Physical Anesthesia Plan  ASA: III  Anesthesia Plan: General   Post-op Pain Management:    Induction: Intravenous  Airway Management Planned: Oral ETT  Additional Equipment:   Intra-op Plan:   Post-operative Plan: Extubation in OR  Informed Consent: I have reviewed the patients History and Physical, chart, labs and discussed the procedure including the risks, benefits and alternatives for the proposed anesthesia with the patient or authorized representative who has indicated his/her understanding and acceptance.   Dental advisory given  Plan Discussed with: CRNA  Anesthesia Plan Comments:         Anesthesia Quick Evaluation

## 2015-08-17 NOTE — Progress Notes (Addendum)
Up to bathroom to void. Voided amber colored urine. Ambulated in hallway. Up to recliner chair with PAS intact.

## 2015-08-17 NOTE — Anesthesia Procedure Notes (Addendum)
Procedure Name: Intubation Date/Time: 08/17/2015 10:17 AM Performed by: Paris Lore Pre-anesthesia Checklist: Patient identified, Emergency Drugs available, Suction available, Patient being monitored and Timeout performed Patient Re-evaluated:Patient Re-evaluated prior to inductionOxygen Delivery Method: Circle system utilized Preoxygenation: Pre-oxygenation with 100% oxygen Intubation Type: IV induction Ventilation: Mask ventilation without difficulty Laryngoscope Size: Mac and 4 Grade View: Grade I Tube type: Oral Tube size: 7.5 mm Number of attempts: 1 Airway Equipment and Method: Stylet Placement Confirmation: ETT inserted through vocal cords under direct vision,  positive ETCO2,  CO2 detector and breath sounds checked- equal and bilateral Secured at: 21 cm Tube secured with: Tape Dental Injury: Teeth and Oropharynx as per pre-operative assessment

## 2015-08-17 NOTE — Progress Notes (Signed)
Patient is alert and oriented.  Pain is controlled, and patient is tolerating fluids.  Plan to advance to protein shake tomorrow.  Reviewed Adjustable gastric band discharge instructions with patient, patient able to articulate understanding.  Provided information on BELT program, Support Group and [facility] outpatient pharmacy. All questions answered, will continue to monitor.  

## 2015-08-17 NOTE — Anesthesia Postprocedure Evaluation (Signed)
  Anesthesia Post-op Note  Patient: Cynthia Webb  Procedure(s) Performed: Procedure(s): LAPAROSCOPIC GASTRIC BANDING WITH HIATAL HERNIA REPAIR  Patient Location: PACU  Anesthesia Type:General  Level of Consciousness: awake, alert  and oriented  Airway and Oxygen Therapy: Patient Spontanous Breathing  Post-op Pain: mild  Post-op Assessment: Post-op Vital signs reviewed and Patient's Cardiovascular Status Stable              Post-op Vital Signs: Reviewed and stable  Last Vitals:  Filed Vitals:   08/17/15 1300  BP: 121/81  Pulse: 69  Temp: 37.1 C  Resp: 18    Complications: No apparent anesthesia complications

## 2015-08-17 NOTE — Discharge Instructions (Signed)
General Anesthesia, Care After Refer to this sheet in the next few weeks. These instructions provide you with information on caring for yourself after your procedure. Your health care provider may also give you more specific instructions. Your treatment has been planned according to current medical practices, but problems sometimes occur. Call your health care provider if you have any problems or questions after your procedure. WHAT TO EXPECT AFTER THE PROCEDURE After the procedure, it is typical to experience:  Sleepiness.  Nausea and vomiting. HOME CARE INSTRUCTIONS  For the first 24 hours after general anesthesia:  Have a responsible person with you.  Do not drive a car. If you are alone, do not take public transportation.  Do not drink alcohol.  Do not take medicine that has not been prescribed by your health care provider.  Do not sign important papers or make important decisions.  You may resume a normal diet and activities as directed by your health care provider.  Change bandages (dressings) as directed.  If you have questions or problems that seem related to general anesthesia, call the hospital and ask for the anesthetist or anesthesiologist on call. SEEK MEDICAL CARE IF:  You have nausea and vomiting that continue the day after anesthesia.  You develop a rash. SEEK IMMEDIATE MEDICAL CARE IF:   You have difficulty breathing.  You have chest pain.  You have any allergic problems. Document Released: 02/05/2001 Document Revised: 11/04/2013 Document Reviewed: 05/15/2013 [facility] Patient Information 2015 [place], [place]. This information is not intended to replace advice given to you by your health care provider. Make sure you discuss any questions you have with your health care provider.                    ADJUSTABLE GASTRIC BAND  Home Care Instructions   These instructions are to help you care for yourself when you go home.  Call: If you have any  problems.  Call [phone] and ask for the surgeon on call  If you need immediate assistance come to the ER at [facility]. Tell the ER staff you are a new post-op gastric banding patient  Signs and symptoms to report:  Severe  vomiting or nausea o If you cannot handle clear liquids for longer than 1 day, call your surgeon  Abdominal pain which does not get better after taking your pain medication  Fever greater than 100.4  F and chills  Heart rate over 100 beats a minute  Trouble breathing  Chest pain  Redness,  swelling, drainage, or foul odor at incision (surgical) sites  If your incisions open or pull apart  Swelling or pain in calf (lower leg)  Diarrhea (Loose bowel movements that happen often), frequent watery, uncontrolled bowel movements  Constipation, (no bowel movements for 3 days) if this happens: o Take Milk of Magnesia, 2 tablespoons by mouth, 3 times a day for 2 days if needed o Stop taking Milk of Magnesia once you have had a bowel movement o Call your doctor if constipation continues Or o Take Miralax  (instead of Milk of Magnesia) following the label instructions o Stop taking Miralax once you have had a bowel movement o Call your doctor if constipation continues  Anything you think is abnormal for you   Normal side effects after surgery:  Unable to sleep at night or unable to concentrate  Irritability  Being tearful (crying) or depressed  These are common complaints, possibly related to your anesthesia, stress of surgery, and  change in lifestyle, that usually go away a few weeks after surgery. If these feelings continue, call your medical doctor.  Wound Care: You may have surgical glue, steri-strips, or staples over your incisions after surgery  Surgical glue: Looks like clear film over your incisions and will wear off a little at a time  Steri-strips: Adhesive strips of tape over your incisions. You may notice a yellowish color on skin under  the steri-strips. This is used to make the steri-strips stick better. Do not pull the steri-strips off - let them fall off  Staples: Staples may be removed before you leave the hospital o If you go home with staples, call [facility] for an appointment with your surgeons nurse to have staples removed 10 days after surgery, [phone]  Showering: You may shower two (2) days after your surgery unless your surgeon tells you differently o Wash gently around incisions with warm soapy water, rinse well, and gently pat dry o If you have a drain (tube from your incision), you may need someone to hold this while you shower o No tub baths until staples are removed and incisions are healed   Medications:  Medications should be liquid or crushed if larger than the size of a dime  Extended release pills (medication that releases a little bit at a time through the  day) should not be crushed  Depending on the size and number of medications you take, you may need to space (take a few throughout the day)/change the time you take your medications so that you do not over-fill your pouch (smaller stomach)  Make sure you follow-up with you primary care physician to make medication changes needed during rapid weight loss and life -style changes  If you have diabetes, follow up with your doctor that orders your diabetes medication(s) within one week after surgery and check your blood sugar regularly   Do not drive while taking narcotics (pain medications)   Do not take acetaminophen (Tylenol) and Roxicet or Lortab Elixir at the same time since these pain medications contain acetaminophen   Diet:  First 2 Weeks You will see the nutritionist about two (2) weeks after your surgery. The nutritionist will increase the types of foods you can eat if you are handling liquids well:  If you have severe vomiting or nausea and cannot handle clear liquids lasting longer than 1 day call your surgeon For  Same Day Surgery Discharge Patients:  The day of surgery drink water only: 2 ounces every 4 hours  If you are handling water, start drinking your high protein shake the next morning For Overnight Stay Patients:  Begin by drinking 2 ounces of a high protein every 3 hours, 5-6 times per day  Slowly increase the amount you drink as tolerated  You may find it easier to slowly sip shakes throughout the day. It is important to get your proteins in first    Protein Shake  Drink at least 2 ounces of shake 5-6 times per day  Each serving of protein shakes (usually 8-12 ounces) should have a minimum of: o 15 grams of protein o And no more than 5 grams of carbohydrate  Goal for protein each day: o Men = 80 grams per day o Women = 60 grams per day  Protein powder may be added to fluids such as non-fat milk or Lactaid milk or Soy milk (limit to 35 grams added protein powder per serving)  Hydration  Slowly increase the amount  of water and other clear liquids as tolerated (See Acceptable Fluids)  Slowly increase the amount of protein shake as tolerated  Sip fluids slowly and throughout the day  May use sugar substitutes in small amounts (no more than 6-8 packets per day; i.e. Splenda)  Fluid Goal  The first goal is to drink at least 8 ounces of protein shake/drink per day (or as directed by the nutritionist); some examples of protein shakes are Syntrax, Nectar, Dillard's, EAS Edge HP, and Unjury. - See handout from pre-op Bariatric Education Class: o Slowly increase the amount of protein shake you drink as tolerated o You may find it easier to slowly sip shakes throughout the day o It is important to get your proteins in first  Your fluid goal is to drink 64-100 ounces of fluid daily o It may take a few weeks to build up to this   32 oz. (or more) should be full liquids (see below for examples)  Liquids should not contain sugar, caffeine, or carbonation  Clear  Liquids:  Water of Sugar-free flavored water (i.e. Fruit HO, Propel)  Decaffeinated coffee or tea (sugar-free)  Crystal lite, Wylers Lite, Minute Maid Lite  Sugar-free Jell-O  Bouillon or broth  Sugar-free Popsicle:    - Less than 20 calories each; Limit 1 per day        Full Liquids:                   Protein Shakes/Drinks + 2 choices per day of other full liquids  Full liquids must be: o No More Than 12 grams of Carbs per serving o No More Than 3 grams of Fat per serving  Strained low-fat cream soup  Non-Fat milk  Fat-free Lactaid Milk  Sugar-free yogurt (Dannon Lite & Fit, Greek yogurt)   Vitamins and Minerals  Start 1 day after surgery unless otherwise directed by your surgeon  1 Chewable Multivitamin / Multimineral Supplement with iron (i.e. Centrum for Adults)  Chewable Calcium Citrate with Vitamin D-3 (Example: 3 Chewable Calcium  Plus 600 with vitamin D-3) o Take 500 mg three (3) times a day for a total of 1500 mg per day o Do not take all 3 doses of calcium at one time as it may cause constipation, and you can only absorb 500 mg at a time o Do not mix multivitamins containing iron with calcium supplements;  take 2 hours apart o Do not substitute Tums (calcium carbonate) for your calcium  Menstruating women and those at risk for anemia ( a blood disease that causes weakness) may need extra iron o Talk to your doctor to see if you need more iron  If you need extra iron: total daily iron recommendation (including Vitamins) is 50 to 100 mg Iron/day  Do not stop taking or change any vitamins or minerals until you talk to your nutritionist or surgeon  Your nutritionist and/or surgeon must approve all vitamin and mineral supplements  Activity and Exercise: It is important to continue walking at home. Limit your physical activity as instructed by your doctor. During this time, use these guidelines:  Do not lift anything greater than ten  (10) pounds for at  least two (2) weeks  Do not go back to work or drive until Designer, industrial/product says you can  You may have sex when you feel comfortable o It is VERY important for female patients to use a reliable birth control method; fertility often increase after surgery o Do not  get pregnant for at least 18 months  Start exercising as soon as your doctor tells you that you can o Make sure your doctor approves any physical activity  Start with a simple walking program  Walk 5-15 minutes each day, 7 days per week  Slowly increase until you are walking 30-45 minutes per day  Consider joining our BELT program. [phone] or email [email or web address]    Special Instructions  Things to remember:  Free counseling is available for you and your family through collaboration between [facility] and [place]. Please call [phone] and leave a message  Use your CPAP when sleeping if this applies to you  Consider buying a medical alert bracelet that says you had lap-band surgery  You will likely have your first fill (fluid added to your band) 6 - 8 weeks after surgery  [facility] has a free Bariatric Surgery Support Group that meets monthly, the 3rd Thursday, 6pm. Calvert Cantor. You can see classes online at [email or web address]  It is very important to keep all follow up appointments with your surgeon, nutritionist, primary care physician, and behavioral health practitioner o After the first year, please follow up with your bariatric surgeon and nutritionist at least once a year in order to maintain best weight loss results                    [facility]:  [phone]               [facility]: [phone]               Bariatric Nurse Coordinator: [phone]      Adjustable Gastric Band Home Care Instructions  Rev. 12/2012                                                                  Reviewed and  Endorsed                                                   by [facility] Patient Education Committee, Jan, 2014

## 2015-08-18 ENCOUNTER — Encounter (HOSPITAL_COMMUNITY): Payer: Self-pay | Admitting: General Surgery

## 2015-08-23 ENCOUNTER — Telehealth (HOSPITAL_COMMUNITY): Payer: Self-pay

## 2015-08-23 NOTE — Telephone Encounter (Addendum)
Attempted DROP discharge call, no answer, left message to return call  Made discharge phone call to patient per DROP protocol. Asking the following questions.    1. Do you have someone to care for you now that you are home?  yes 2. Are you having pain now that is not relieved by your pain medication?  no 3. Are you able to drink the recommended daily amount of fluids (48 ounces minimum/day) and protein (60-80 grams/day) as prescribed by the dietitian or nutritional counselor?  yes 4. Are you taking the vitamins and minerals as prescribed? yes  5. Do you have the "on call" number to contact your surgeon if you have a problem or question?  yes 6. Are your incisions free of redness, swelling or drainage? (If steri strips, address that these can fall off, shower as tolerated) yes 7. Have your bowels moved since your surgery?  If not, are you passing gas?  yes 8. Are you up and walking 3-4 times per day?  yes     

## 2015-08-26 ENCOUNTER — Encounter (HOSPITAL_COMMUNITY): Payer: Self-pay | Admitting: General Surgery

## 2015-08-31 ENCOUNTER — Encounter: Payer: BLUE CROSS/BLUE SHIELD | Attending: General Surgery

## 2015-08-31 DIAGNOSIS — Z6837 Body mass index (BMI) 37.0-37.9, adult: Secondary | ICD-10-CM | POA: Insufficient documentation

## 2015-08-31 DIAGNOSIS — Z713 Dietary counseling and surveillance: Secondary | ICD-10-CM | POA: Diagnosis not present

## 2015-08-31 NOTE — Progress Notes (Signed)
Bariatric Class:  Appt start time: 1530 end time:  1630.  2 Week Post-Operative Nutrition Class  Patient was seen on 08/31/15 for Post-Operative Nutrition education [facility].   Surgery date: 08/17/15 Surgery type: LAGB Start weight at [facility]: 259 lbs on 03/30/15 Weight today: 241.0 lbs lbs  Weight change: 11.4 lbs  TANITA  BODY COMP RESULTS  08/09/15 08/31/15   BMI (kg/m^2) N/A 36.6   Fat Mass (lbs)  115.5   Fat Free Mass (lbs)  125.5   Total Body Water (lbs)  92.0    The following the learning objectives were met by the patient during this course:  Identifies Phase 3A (Soft, High Proteins) Dietary Goals and will begin from 2 weeks post-operatively to 2 months post-operatively  Identifies appropriate sources of fluids and proteins   States protein recommendations and appropriate sources post-operatively  Identifies the need for appropriate texture modifications, mastication, and bite sizes when consuming solids  Identifies appropriate multivitamin and calcium sources post-operatively  Describes the need for physical activity post-operatively and will follow MD recommendations  States when to call healthcare provider regarding medication questions or post-operative complications  Handouts given during class include:  Phase 3A: Soft, High Protein Diet Handout  Follow-Up Plan: Patient will follow-up at [facility] in 6 weeks for 2 month post-op nutrition visit for diet advancement per MD.

## 2015-10-05 ENCOUNTER — Encounter: Payer: BLUE CROSS/BLUE SHIELD | Attending: General Surgery | Admitting: Dietician

## 2015-10-05 ENCOUNTER — Encounter: Payer: Self-pay | Admitting: Dietician

## 2015-10-05 DIAGNOSIS — Z713 Dietary counseling and surveillance: Secondary | ICD-10-CM | POA: Diagnosis not present

## 2015-10-05 DIAGNOSIS — Z6837 Body mass index (BMI) 37.0-37.9, adult: Secondary | ICD-10-CM | POA: Insufficient documentation

## 2015-10-05 NOTE — Patient Instructions (Addendum)
Goals:  Follow Phase 3B: High Protein + Non-Starchy Vegetables  Eat 3-6 small meals/snacks, every 3-5 hrs  Increase lean protein foods to meet 60g goal  Increase fluid intake to 64oz +  Avoid drinking 15 minutes before, during and 30 minutes after eating  Aim for >30 min of physical activity daily  Surgery date: 08/17/15 Surgery type: LAGB [facility]: [facility] lbs on 03/30/15 Weight today: 233 lbs   Weight change: 8 lbs Total weight loss: 26 lbs  TANITA  BODY COMP RESULTS  08/09/15 08/31/15 10/05/15   BMI (kg/m^2) N/A 36.6 35.4   Fat Mass (lbs)  115.5 110.5   Fat Free Mass (lbs)  125.5 122.5   Total Body Water (lbs)  92.0 89.5

## 2015-10-05 NOTE — Progress Notes (Signed)
  Follow-up visit:  6  Weeks Post-Operative LAGB Surgery  Medical Nutrition Therapy:  Appt start time: 1645 end time:  1715.  Primary concerns today: Post-operative Bariatric Surgery Nutrition Management. Returns with an 8 lb weight loss. Had a fill last week. Does not feel any difference in restriction since fill. Feels like she can eat all foods except for bread. Eating less some days and more other days. Feeling more hungry now than before surgery and hunger can come on quickly. Sometimes has protein shake at bedtime to avoid waking up hungry at night.   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   Surgery date: 08/17/15 Surgery type: [facility]AGB [facility] lbs on 03/30/15 Weight today: 233 lbs   Weight change: 8 lbs Total weight loss: 26 lbs  TANITA  BODY COMP RESULTS  08/09/15 08/31/15 10/05/15   BMI (kg/m^2) N/A 36.6 35.4   Fat Mass (lbs)  115.5 110.5   Fat Free Mass (lbs)  125.5 122.5   Total Body Water (lbs)  92.0 89.5    Preferred Learning Style:   No preference indicated   Learning Readiness:   Ready  24-hr recall: B (AM): Premier Protein Shake (30 g) Snk (AM): cheese, nuts, and fruit S[place]ento brand or P3 (12-15 g)  L (PM): 3-4 oz chicken/beef or Protein shake (21-28 g) Snk (PM): beef jerky or S[place]ento nuts, cheese, fruit (12-15 g) D (PM): 4-5 oz meat with spinach salad (28-35 g) Snk (PM): sometimes Protein Shake  Fluid intake: 11-22 oz protein shake, 60-64 oz water, 20 oz propel Estimated total protein intake: 100+ g  Medications: see list Supplementation: taking  Using straws: No Drinking while eating: No, tries to wait 30 minutes before drinking after meals Hair loss: No Carbonated beverages: No N/V/D/C: No, though has a bowel movement every day which is new for her Dumping syndrome: No Last Lap-Band fill: 01 cc fill week of 09/27/2015, total of 4 cc  Recent physical activity:  Walking 4-5 laps every 1-2 hours while  at work   Progress Towards Goal(s):  In progress.  Handouts given during visit include:  Phase 3B High Protein + Non Starchy Vegetables   Nutritional Diagnosis:  -3.3 Overweight/obesity related to past poor dietary habits and physical inactivity as evidenced by patient w/ recent LAGB surgery following dietary guidelines for continued weight loss.    Intervention:  Nutrition education/diet advancement. Goals:  Follow Phase 3B: High Protein + Non-Starchy Vegetables  Eat 3-6 small meals/snacks, every 3-5 hrs  Increase lean protein foods to meet 60g goal  Increase fluid intake to 64oz +  Avoid drinking 15 minutes before, during and 30 minutes after eating  Aim for >30 min of physical activity daily   Teaching Method Utilized:  Visual Auditory Hands on  Barriers to learning/adherence to lifestyle change: none  Demonstrated degree of understanding via:  Teach Back   Monitoring/Evaluation:  Dietary intake, exercise, lap band fills, and body weight. Follow up in 2 months for 4 month post-op visit.

## 2015-12-08 ENCOUNTER — Encounter: Payer: BLUE CROSS/BLUE SHIELD | Attending: General Surgery | Admitting: Dietician

## 2015-12-08 ENCOUNTER — Encounter: Payer: Self-pay | Admitting: Dietician

## 2015-12-08 DIAGNOSIS — Z6837 Body mass index (BMI) 37.0-37.9, adult: Secondary | ICD-10-CM | POA: Diagnosis not present

## 2015-12-08 DIAGNOSIS — Z713 Dietary counseling and surveillance: Secondary | ICD-10-CM | POA: Diagnosis not present

## 2015-12-08 NOTE — Progress Notes (Signed)
  Follow-up visit:  3.5 months Post-Operative LAGB Surgery  Medical Nutrition Therapy:  Appt start time: 805 end time:  835  Primary concerns today: Post-operative Bariatric Surgery Nutrition Management. Returns with an 8 lb weight loss. Had a fill last week. Does not feel any difference in restriction since fill. Feels like she can eat all foods except for bread. Eating less some days and more other days. Feeling more hungry now than before surgery and hunger can come on quickly. Sometimes has protein shake at bedtime to avoid waking up hungry at night.   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   Cynthia Webb returns having lost 5 lbs fat. She states her menstrual cycle has been abnormal. Noticing hair loss and taking Biotin. Got a fill 3 weeks ago (total 6.5 cc in band). Has felt some restriction, especially with 1st bite of food. Feels like her weight is at a plateau. Has recently started going to a 1-hour fitness class (circuits and cardio) 2x a week. Craving "weird things" like grape soda and peanut butter. Weighs herself every day but does not let this affect her meal pattern.    Samples provided and patient instructed on proper use: PB2 (qty 2) Lot#: [id number] Exp: 08/2017  Unjury protein powder (chocolate - qty 2) Lot#: [id number] Exp: 05/2016  Surgery date: 08/17/15 Surgery type: LAGB Start weight at [facility]: 259 lbs on 03/30/15 Weight today: 229.5 Weight change: 3.5 lbs (5 lbs fat) Total weight loss: 29.5 lbs Goal weight: 120-140 lbs  TANITA  BODY COMP RESULTS  08/09/15 08/31/15 10/05/15 12/08/15   BMI (kg/m^2) N/A 36.6 35.4 34.9   Fat Mass (lbs)  115.5 110.5 105.5   Fat Free Mass (lbs)  125.5 122.5 124   Total Body Water (lbs)  92.0 89.5 91    Preferred Learning Style:   No preference indicated   Learning Readiness:   Ready  24-hr recall: B (AM): Premier Protein Shake (30 g) Snk (AM):  L (PM): P3 pack (12g) Snk (PM): nuts D (PM): 4-5 oz  chicken and small amount of pasta (28-35g)  Snk (PM): sometimes Protein Shake or "something I shouldn't eat"  Fluid intake: 11-22 oz protein shake, 60-64 oz water, 20 oz propel Estimated total protein intake: 75-100 g/day  Medications: see list Supplementation: taking  Using straws: No Drinking while eating: No Hair loss: yes, taking Biotin Carbonated beverages: yes, grape soda sometimes (carbonation does not cause pain) N/V/D/C: bowels are regular Dumping syndrome: No Last Lap-Band fill: 3 weeks ago (6.5 cc total)  Recent physical activity:  Walking 4-5 laps every 1-2 hours while at work   Progress Towards Goal(s):  In progress.  Handouts given during visit include:  Phase 3B High Protein + Non Starchy Vegetables   Nutritional Diagnosis:  [place]-3.3 Overweight/obesity related to past poor dietary habits and physical inactivity as evidenced by patient w/ recent LAGB surgery following dietary guidelines for continued weight loss.    Intervention:  Nutrition education/diet advancement.  Teaching Method Utilized:  Visual Auditory Hands on  Barriers to learning/adherence to lifestyle change: none  Demonstrated degree of understanding via:  Teach Back   Monitoring/Evaluation:  Dietary intake, exercise, lap band fills, and body weight. Follow up in 4-6 weeks for 4.5-5 month post-op visit.

## 2015-12-08 NOTE — Patient Instructions (Signed)
Goals:  Follow Phase 3B: High Protein + Non-Starchy Vegetables  Eat 3-6 small meals/snacks, every 3-5 hrs  Increase lean protein foods to meet 60g goal  Increase fluid intake to 64oz +  Avoid drinking 15 minutes before, during and 30 minutes after eating  Continue exercise routine!  Avoid added carbs and sugars  Try Hawaiian Punch sugar free grape water flavoring  Try PB2 cups  Surgery date: 08/17/15 Surgery type: LAGB Start weight at [facility]: 259 lbs on 03/30/15 Weight today: 229.5 Weight change: 3.5 lbs (5 lbs fat) Total weight loss: 29.5 lbs Goal weight: 120-140 lbs  TANITA  BODY COMP RESULTS  08/09/15 08/31/15 10/05/15 12/08/15   BMI (kg/m^2) N/A 36.6 35.4 34.9   Fat Mass (lbs)  115.5 110.5 105.5   Fat Free Mass (lbs)  125.5 122.5 124   Total Body Water (lbs)  92.0 89.5 91

## 2016-01-14 ENCOUNTER — Ambulatory Visit: Payer: BLUE CROSS/BLUE SHIELD | Admitting: Dietician

## 2016-02-02 ENCOUNTER — Encounter: Payer: BLUE CROSS/BLUE SHIELD | Attending: General Surgery | Admitting: Dietician

## 2016-02-02 ENCOUNTER — Encounter: Payer: Self-pay | Admitting: Dietician

## 2016-02-02 DIAGNOSIS — Z6837 Body mass index (BMI) 37.0-37.9, adult: Secondary | ICD-10-CM | POA: Diagnosis not present

## 2016-02-02 DIAGNOSIS — Z713 Dietary counseling and surveillance: Secondary | ICD-10-CM | POA: Insufficient documentation

## 2016-02-02 NOTE — Progress Notes (Signed)
Follow-up visit:  5.5 months Post-Operative LAGB Surgery  Medical Nutrition Therapy:  Appt start time: 730 end time:  815  Primary concerns today: Post-operative Bariatric Surgery Nutrition Management. Returns with an 1.5 lb weight gain. Being tested to see if she has PCOS. Has been menstruating continuously lately. Feeling frustrated that she is not losing weight. Had a fill about 3-4 weeks ago. Feels good in the first couple of weeks after a fill (feels restriction) and as times goes by does not feel restriction. Has 7.5 cc in band (approximately).   Still feels like she can eat all foods except for bread. Tried a hush puppy and vomited it. Hunger level is better than before surgery and now gets hungry about 4 times per day. Keeps to a regular eating schedule.   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 Having a lot of stress and having a lot of mood swings. Has not been able to exercise. Sleeping better than before with aBelsomra (doesn't take it every night).   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   Surgery date: 08/17/15 Surgery type: LAGB [facility]: 259 lbs on 03/30/15 Weight today: 231.0 lbs   Weight change: 3.5 lbs (5 lbs fat) Total weight loss: 29.5 lbs Goal weight: 120-140 lbs  TANITA  BODY COMP RESULTS  08/09/15 08/31/15 10/05/15 12/08/15 02/02/16   BMI (kg/m^2) N/A 36.6 35.4 34.9 35.1   Fat Mass (lbs)  115.5 110.5 105.5 106.0   Fat Free Mass (lbs)  125.5 122.5 124 125.0   Total Body Water (lbs)  92.0 89.5 91 91.5    Preferred Learning Style:   No preference indicated   Learning Readiness:   Ready  24-hr recall: B (AM): Premier Protein Shake (30 g) Snk (11 AM): around a 1/4 cup  nuts L (PM): 3-4 oz shredded chicken with with cheese (21-28g) Snk (PM): none D (PM): 4 oz chicken pieces and nuts or sometimes artichoke (28 g) Snk (PM): chex mix if craves something crunchy  Fluid intake: grape sugar free  Kool Aid , Power Aid zero, water,  8 oz milk, 11 oz protein shake (80-100 oz total) Estimated total protein intake: 79--86 g/day  Medications: see list Supplementation: taking, added Biotin  Using straws: No Drinking while eating: No, tries to wait 30 minutes to drink Hair loss: yes, though getting better Carbonated beverages: ginger ale if feeling nauseas  N/V/D/C: vomiting with hush puppy or eating too quickly, bowels are regular Dumping syndrome: No Last Lap-Band fill: 3 weeks ago (7.5 cc total)  Recent physical activity:  Has not had time yet, but planning to training for a 5 k    Progress Towards Goal(s):  In progress.  Handouts given during visit include: none   Nutritional Diagnosis:  [place]-3.3 Overweight/obesity related to past poor dietary habits and physical inactivity as evidenced by patient w/ recent LAGB surgery following dietary guidelines for continued weight loss.    Intervention:  Nutrition education/diet reinforcement Goals:  Follow Phase 3B: High Protein + Non-Starchy Vegetables  Eat 3-6 small meals/snacks, every 3-5 hrs  Increase lean protein foods to meet 60g goal  Increase fluid intake to 64oz +  Avoid drinking 15 minutes before, during and 30 minutes after eating  Try Quest Protein chips, Parmesans Crips, or kale chips for something crunchy (400 degrees, thin layer, with olive oil, salt and pepper for about 15 minutes)  Or try crunchy chick peas  or edamame from [employer]  Start back to exercise in April  Focus on eating mindfully, chew well, eat without distractions when possible, and stop eating when no longer hungry    Reduce stress wherever possible  Teaching Method Utilized:  Visual Auditory Hands on  Barriers to learning/adherence to lifestyle change: none  Demonstrated degree of understanding via:  Teach Back   Monitoring/Evaluation:  Dietary intake, exercise, lap band fills, and body weight. Follow up in 2 months for 7.5 month post-op  visit.

## 2016-02-02 NOTE — Patient Instructions (Addendum)
Goals:  Follow Phase 3B: High Protein + Non-Starchy Vegetables  Eat 3-6 small meals/snacks, every 3-5 hrs  Increase lean protein foods to meet 60g goal  Increase fluid intake to 64oz +  Avoid drinking 15 minutes before, during and 30 minutes after eating  Try Quest Protein chips, Parmesans Crips, or kale chips for something crunchy (400 degrees, thin layer, with olive oil, salt and pepper for about 15 minutes)  Or try crunchy chick peas or edamame from [employer]  Start back to exercise in April  Focus on eating mindfully, chew well, eat without distractions when possible, and stop eating when no longer hungry    Reduce stress wherever possible  Surgery date: 08/17/15 Surgery type: LAGB [facility]: [facility] lbs on 03/30/15 Weight today: 231.0 lbs  Weight change: 1.5 lbs gain Total weight loss: 29.5 lbs Goal weight: 120-140 lbs  TANITA  BODY COMP RESULTS  08/09/15 08/31/15 10/05/15 12/08/15 02/02/16   BMI (kg/m^2) N/A 36.6 35.4 34.9 35.1   Fat Mass (lbs)  115.5 110.5 105.5 106.0   Fat Free Mass (lbs)  125.5 122.5 124 125.0   Total Body Water (lbs)  92.0 89.5 91 91.5

## 2016-02-15 IMAGING — DX DG ABDOMEN 1V
2 series · 2 of 2 positions shown · non-contrast
Comparison: None.

CLINICAL DATA: Postoperative laparoscopic gastric banding with
hiatal hernia repair.

EXAM:
ABDOMEN - 1 VIEW

[abdomen kub (1 of 2)]
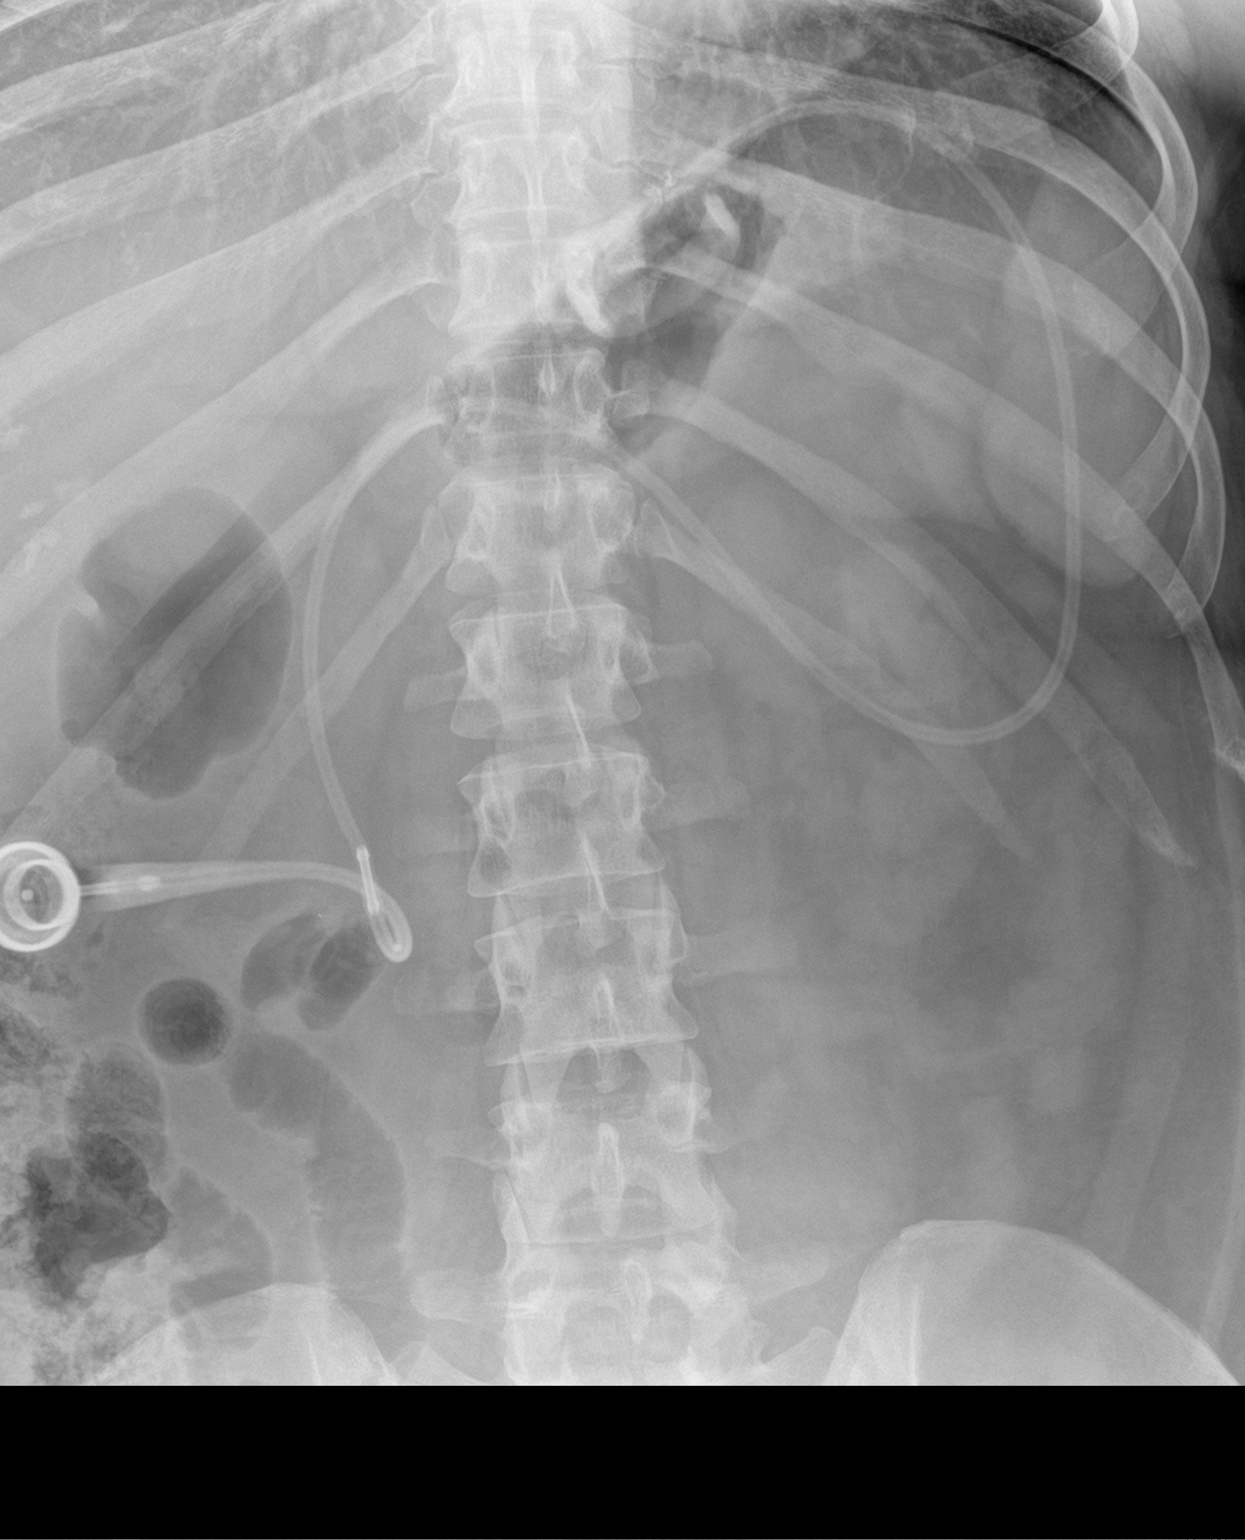

[abdomen kub (2 of 2)]
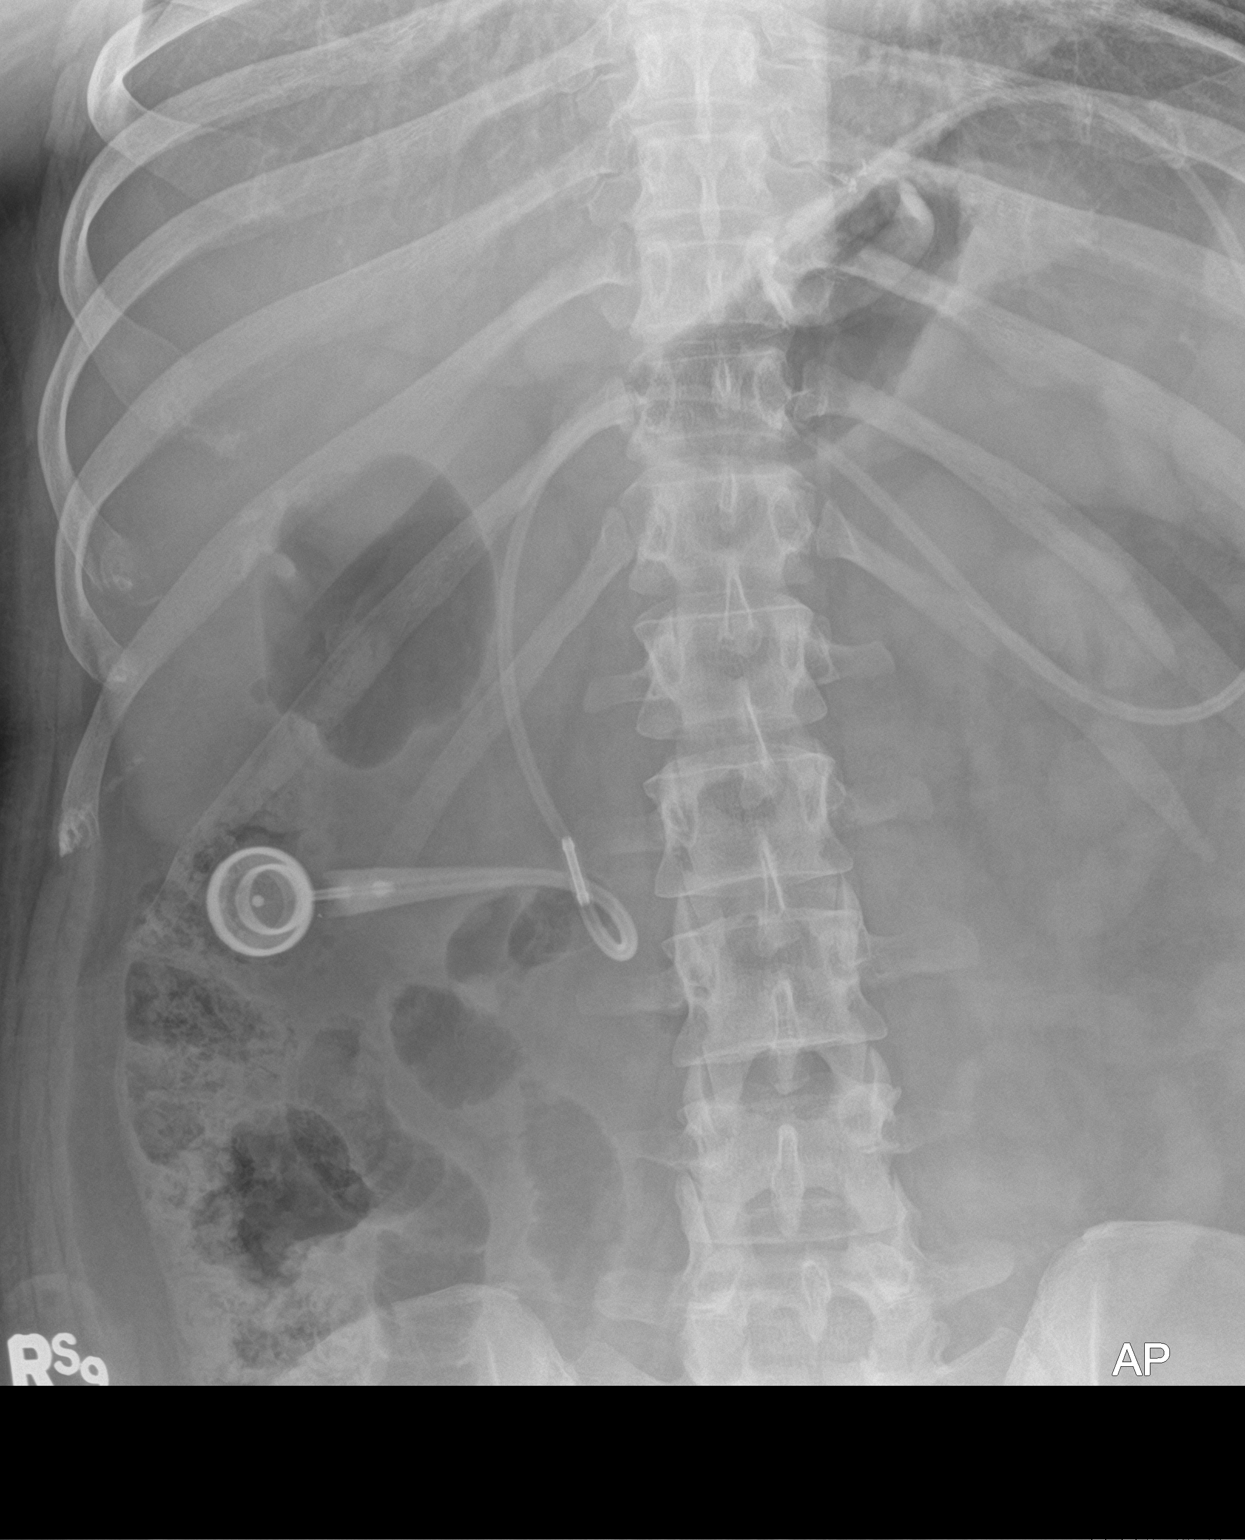

[2 of 2 positions shown; findings below may reference images not displayed]

FINDINGS: The bowel gas pattern is normal. Radiopaque tube is projected over
the stomach and right mid abdomen.
IMPRESSION: Radiopaque tube is projected over the stomach and right mid abdomen.

## 2016-04-03 ENCOUNTER — Encounter: Payer: BLUE CROSS/BLUE SHIELD | Attending: General Surgery | Admitting: Dietician

## 2016-04-03 ENCOUNTER — Encounter: Payer: Self-pay | Admitting: Dietician

## 2016-04-03 DIAGNOSIS — Z6837 Body mass index (BMI) 37.0-37.9, adult: Secondary | ICD-10-CM | POA: Insufficient documentation

## 2016-04-03 DIAGNOSIS — Z713 Dietary counseling and surveillance: Secondary | ICD-10-CM | POA: Insufficient documentation

## 2016-04-03 NOTE — Progress Notes (Signed)
Follow-up visit:  7.5 months Post-Operative LAGB Surgery  Medical Nutrition Therapy:  Appt start time: 805 end time:  850  Primary concerns today: Post-operative Bariatric Surgery Nutrition Management. Returns with an 15 lb weight loss. Getting very frustrated that people keep asking her if she is pregnant. Had a "breakdown" at work about this. Lost motivation for going to the gym. Has a lot of job stress. May have PCOS and started metformin since last appointment. Hairs on face are now gone. Had a fill around 3 weeks ago. Restriction was really good as first and now feels "looser" and able to eat easier. Feels a lot of restriction in the morning.  Still feels like she can eat all foods except for bread. Not getting very hungry but does feel some hunger.   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 Has been feeling more tired lately and not sure why.   Surgery date: 08/17/15 Surgery type: LAGB Start weight at [facility]: 259 lbs on 03/30/15 Weight today: 216.0 lbs   Weight change: 15 lbs  Total weight loss:43 lbs Goal weight: 120-140 lbs  TANITA  BODY COMP RESULTS  08/09/15 08/31/15 10/05/15 12/08/15 02/02/16 04/03/16   BMI (kg/m^2) N/A 36.6 35.4 34.9 35.1 32.8   Fat Mass (lbs)  115.5 110.5 105.5 106.0 96.8   Fat Free Mass (lbs)  125.5 122.5 124 125.0 119.2   Total Body Water (lbs)  92.0 89.5 91 91.5 86.6    Preferred Learning Style:   No preference indicated   Learning Readiness:   Ready  24-hr recall: B (9-10 AM): Premier Protein Shake or seaweed snacks (3-30 g) Snk (AM): none L (PM): broccoli cheddar soup or  3 oz shredded chicken with with cheese (5-25g) Snk (PM): none or edamame or kale chips or roasted chick peas (0-6 g) D (PM): 4-5 oz chicken/ribs/fish/shrimp (28-35 g) Snk (PM): 1-2 spoonfuls Halo ice cream  Has protein shake 4-5 x week.   Fluid intake: grape sugar free Kool Aid , Power Aid zero, water,  8 oz almond milk, 11 oz protein shake (80-100 oz  total), ginger ale sometimes Estimated total protein intake: 36-90 g/day  Medications: see list Supplementation: taking, added Biotin  Using straws: yes for water Drinking while eating: No, tries to wait 30 minutes to drink (doesn't time herself) Hair loss: No Carbonated beverages: ginger ale if feeling nauseas (rare) N/V/D/C: vomiting after fill, had some diarrhea Dumping syndrome: No Last Lap-Band fill: 3 weeks ago (6.5 cc total)  Recent physical activity:  Planning to go back to the gym soon  Progress Towards Goal(s):  In progress.  Handouts given during visit include: none   Nutritional Diagnosis:  [place]-3.3 Overweight/obesity related to past poor dietary habits and physical inactivity as evidenced by patient w/ recent LAGB surgery following dietary guidelines for continued weight loss.    Intervention:  Nutrition education/diet reinforcement Goals:  Follow Phase 3B: High Protein + Non-Starchy Vegetables  Eat 3-6 small meals/snacks, every 3-5 hrs  Increase lean protein foods to meet 60g goal  Increase fluid intake to 64oz +  Avoid drinking 15 minutes before, during and 30 minutes after eating  Start back to exercise when you are ready (notice how you feel after exercise)  Continue to focus on eating mindfully, chew well, eat without distractions when possible, and stop eating when no longer hungry    Reduce stress wherever possible - look for a new job  Avoid soup since it doesn't have a  lot of protein  Teaching Method Utilized:  Visual Auditory Hands on  Barriers to learning/adherence to lifestyle change: none  Demonstrated degree of understanding via:  Teach Back   Monitoring/Evaluation:  Dietary intake, exercise, lap band fills, and body weight. Follow up in 2 months for 9.5 month post-op visit.

## 2016-04-03 NOTE — Patient Instructions (Addendum)
Goals:  Follow Phase 3B: High Protein + Non-Starchy Vegetables  Eat 3-6 small meals/snacks, every 3-5 hrs  Increase lean protein foods to meet 60g goal  Increase fluid intake to 64oz +  Avoid drinking 15 minutes before, during and 30 minutes after eating  Start back to exercise when you are ready (notice how you feel after exercise)  Continue to focus on eating mindfully, chew well, eat without distractions when possible, and stop eating when no longer hungry    Reduce stress wherever possible - look for a new job  Avoid soup since it doesn't have a lot of protein  Surgery date: 08/17/15 Surgery type: LAGB [facility]: [facility] lbs on 03/30/15 Weight today: 216.0 lbs   Weight change: 15 lbs  Total weight loss:43 lbs Goal weight: 120-140 lbs  TANITA  BODY COMP RESULTS  08/09/15 08/31/15 10/05/15 12/08/15 02/02/16 04/03/16   BMI (kg/m^2) N/A 36.6 35.4 34.9 35.1 32.8   Fat Mass (lbs)  115.5 110.5 105.5 106.0 96.8   Fat Free Mass (lbs)  125.5 122.5 124 125.0 119.2   Total Body Water (lbs)  92.0 89.5 91 91.5 86.6

## 2016-06-06 ENCOUNTER — Encounter: Payer: BLUE CROSS/BLUE SHIELD | Attending: General Surgery | Admitting: Dietician

## 2016-06-06 DIAGNOSIS — Z6837 Body mass index (BMI) 37.0-37.9, adult: Secondary | ICD-10-CM | POA: Insufficient documentation

## 2016-06-06 DIAGNOSIS — Z713 Dietary counseling and surveillance: Secondary | ICD-10-CM | POA: Insufficient documentation

## 2016-06-06 NOTE — Patient Instructions (Addendum)
Goals:  Follow Phase 3B: High Protein + Non-Starchy Vegetables  Eat 3-6 small meals/snacks, every 3-5 hrs  Increase lean protein foods to meet 60g goal  Increase fluid intake to 64oz +  Avoid drinking 15 minutes before, during and 30 minutes after eating  Continue to focus on eating mindfully, chew well, eat without distractions when possible, and stop eating when no longer hungry   Start doing crunches at night   Surgery date: 08/17/15 Surgery type: LAGB Start weight at [facility]: 259 lbs on 03/30/15 Weight today: 208.0 lbs   Weight change: 8.0 lbs  Total weight loss:51 lbs Goal weight: 120-140 lbs  TANITA  BODY COMP RESULTS  08/09/15 08/31/15 10/05/15 12/08/15 02/02/16 04/03/16 06/06/16   BMI (kg/m^2) N/A 36.6 35.4 34.9 35.1 32.8 31.6   Fat Mass (lbs)  115.5 110.5 105.5 106.0 96.8 91.2   Fat Free Mass (lbs)  125.5 122.5 124 125.0 119.2 116.8   Total Body Water (lbs)  92.0 89.5 91 91.5 86.6 84.8

## 2016-06-06 NOTE — Progress Notes (Signed)
  Follow-up visit:  9 months Post-Operative LAGB Surgery  Medical Nutrition Therapy:  Appt start time: 805 end time:  830  Primary concerns today: Post-operative Bariatric Surgery Nutrition Management. Returns with an 8 lb weight loss. Had a band fill last Thursday and has more restriction now than ever before. Feels like she is "producing more saliva and mucus" since the fill. So hasn't been able to drink as much at night.   Stress level is better than last time. Trying to let go of work problems. Has been feeling tired so having trouble getting up to to exercise. Switched to a liquid metformin since the pills are so big.   Not getting very hungry. Tomorrow is last day of summer session and will be off for 3 weeks.   Surgery date: 08/17/15 Surgery type: LAGB Start weight at [facility]: 259 lbs on 03/30/15 Weight today: 208.0 lbs   Weight change: 8.0 lbs  Total weight loss:51 lbs Goal weight: 120-140 lbs  TANITA  BODY COMP RESULTS  08/09/15 08/31/15 10/05/15 12/08/15 02/02/16 04/03/16 06/06/16   BMI (kg/m^2) N/A 36.6 35.4 34.9 35.1 32.8 31.6   Fat Mass (lbs)  115.5 110.5 105.5 106.0 96.8 91.2   Fat Free Mass (lbs)  125.5 122.5 124 125.0 119.2 116.8   Total Body Water (lbs)  92.0 89.5 91 91.5 86.6 84.8    Preferred Learning Style:   No preference indicated   Learning Readiness:   Ready  24-hr recall: B (9-10 AM): Premier Protein Shake or seaweed snacks (30 g) Snk (AM): none L (PM): Protein shake (30 g) Snk (PM): none  D (PM): 1-2 oz chicken (7-10 g) Snk (PM): 1-2 spoonfuls Halo ice cream  Has protein shake 4-5 x week.   Fluid intake: grape sugar free Kool Aid , Power Aid zero, water, 30 oz protein shake at least 64 oz  Estimated total protein intake: 60-70 g/day  Medications: see list Supplementation: taking, added Biotin  Using straws: yes for water Drinking while eating: No Hair loss: No Carbonated beverages: No N/V/D/C: vomiting after fill, had some diarrhea Dumping  syndrome: No Last Lap-Band fill: last week (8 cc total)  Recent physical activity:  Swimming 3 x week   Progress Towards Goal(s):  In progress.  Handouts given during visit include: none   Nutritional Diagnosis:  [place]-3.3 Overweight/obesity related to past poor dietary habits and physical inactivity as evidenced by patient w/ recent LAGB surgery following dietary guidelines for continued weight loss.    Intervention:  Nutrition education/diet reinforcement Goals:  Follow Phase 3B: High Protein + Non-Starchy Vegetables  Eat 3-6 small meals/snacks, every 3-5 hrs  Increase lean protein foods to meet 60g goal  Increase fluid intake to 64oz +  Avoid drinking 15 minutes before, during and 30 minutes after eating  Continue to focus on eating mindfully, chew well, eat without distractions when possible, and stop eating when no longer hungry   Start doing crunches at night   Teaching Method Utilized:  Visual Auditory Hands on  Barriers to learning/adherence to lifestyle change: none  Demonstrated degree of understanding via:  Teach Back   Monitoring/Evaluation:  Dietary intake, exercise, lap band fills, and body weight. Follow up prn

## 2020-02-09 ENCOUNTER — Other Ambulatory Visit: Payer: Self-pay

## 2020-02-10 ENCOUNTER — Telehealth: Payer: Self-pay

## 2020-02-10 ENCOUNTER — Ambulatory Visit: Payer: BC Managed Care – PPO | Admitting: Endocrinology

## 2020-02-10 ENCOUNTER — Encounter: Payer: Self-pay | Admitting: Endocrinology

## 2020-02-10 VITALS — BP 124/80 | HR 90 | Ht 68.0 in | Wt 226.0 lb

## 2020-02-10 DIAGNOSIS — L680 Hirsutism: Secondary | ICD-10-CM | POA: Diagnosis not present

## 2020-02-10 DIAGNOSIS — R5383 Other fatigue: Secondary | ICD-10-CM

## 2020-02-10 DIAGNOSIS — E063 Autoimmune thyroiditis: Secondary | ICD-10-CM | POA: Diagnosis not present

## 2020-02-10 DIAGNOSIS — R635 Abnormal weight gain: Secondary | ICD-10-CM

## 2020-02-10 LAB — T3, FREE: T3, Free: 3.2 pg/mL (ref 2.3–4.2)

## 2020-02-10 LAB — TSH: TSH: 0.86 u[IU]/mL (ref 0.35–4.50)

## 2020-02-10 LAB — T4, FREE: Free T4: 0.52 ng/dL — ABNORMAL LOW (ref 0.60–1.60)

## 2020-02-10 NOTE — Telephone Encounter (Signed)
From [place]:  Just called [facility] to have the last 3 years of the [facility] faxed to me. Was told it would take 2 to 3 days to process the request.  Will continue to await results for Dr. Remus Blake review

## 2020-02-10 NOTE — Telephone Encounter (Signed)
From Dr. Lucianne Muss:  pl get tsh levels from pcp for 3 years  Called Dr. Stefanie Libel office (referring provider) to obtain records as requested. Spoke with Ladona Ridgel. States pt records only reflect TSH levels dating back to 2020. Advised she will fax to [phone]. Will await records.

## 2020-02-10 NOTE — Telephone Encounter (Signed)
Lab results have been received and have been given to Dr. Lucianne Muss for him to review.

## 2020-02-10 NOTE — Progress Notes (Signed)
Patient ID: Cynthia Webb, female   DOB: 1981-02-22, 39 y.o.   MRN: [id number]            Referring Provider: Dr. Ardelle Park, Leona Carry  Reason for Appointment:  Hypothyroidism, new visit    History of Present Illness:   Hypothyroidism was first diagnosed in 2011  At the time of diagnosis patient was having symptoms of   extreme fatigue, somnolence, feeling of brain fog and does not remember other symptoms.  She thinks she was having symptoms for 10 years She says she was told her symptoms resembled hypothyroidism but her tests would be normal No details of previous labs are available  However the patient was started on Synthroid treatment by her PCP because she was found to have antibody tests for Hashimoto's disease She did not significantly feel any better with taking Synthroid and a couple of years later was switched to Armour Thyroid.  She thinks that with Armour Thyroid her fatigue was better but not normal         The patient has been treated with  various doses of Armour Thyroid and she thinks that about 1 to 1-1/2 years ago the dose was increased from 90 up to 120 mg daily.  She may have felt a little better with the dosage increase  In the last 3 to 4 months she has had increasing fatigue and sleepiness.  She tries to be active but she tends to get tired easily She will take naps on the weekend No new symptoms of significant weight gain, cold intolerance or constipation She said that she feels she has difficulty with memory, absorbing details when she is studying and has to make notes.  She feels like she has brain fog. She thinks that she is having generalized swelling and feels bloated.  She also thinks that her tongue is relatively large  Her TSH in 2020 was normal TSH in 12/2019 was 2.0 Free T4 and free T3 levels are not able  The patient takes the thyroid supplement with breakfast In the last week has been also taking iron tablets         Patient's weight history is as  follows:  Wt Readings from Last 3 Encounters:  02/10/20 226 lb (102.5 kg)  06/06/16 208 lb (94.3 kg)  04/03/16 216 lb (98 kg)    Thyroid function results have been as follows:  No results found for: TSH, FREET4, T3FREE  No thyroid ultrasound reports available, apparently she was previously found to have stable thyroid nodules as of about 3 years ago   Past Medical History:  Diagnosis Date  . Anxiety   . Complication of anesthesia    panic when wakes up   . Depression   . Hyperlipidemia   . Hypothyroidism     Past Surgical History:  Procedure Laterality Date  . KNEE SURGERY    . LAPAROSCOPIC GASTRIC BANDING WITH HIATAL HERNIA REPAIR  08/17/2015   Procedure: LAPAROSCOPIC GASTRIC BANDING WITH HIATAL HERNIA REPAIR;  Surgeon: Gaynelle Adu, MD;  Location: [facility];  Service: General;;    Family History  Problem Relation Age of Onset  . Diabetes Mother   . Hashimoto's thyroiditis Mother      pots  Social History:  reports that she has never smoked. She has never used smokeless tobacco. She reports current alcohol use. She reports that she does not use drugs.  Allergies: No Known Allergies  Allergies as of 02/10/2020   No Known Allergies  Medication List       Accurate as of February 10, 2020  8:50 AM. If you have any questions, ask your nurse or doctor.        STOP taking these medications   Belsomra 15 MG Tabs Generic drug: Suvorexant Stopped by: Reather Littler, MD   cholecalciferol 1000 units tablet Commonly known as: VITAMIN D Stopped by: Reather Littler, MD   metFORMIN 500 MG (MOD) 24 hr tablet Commonly known as: GLUMETZA Stopped by: Reather Littler, MD   multivitamin with minerals Tabs tablet Stopped by: Reather Littler, MD   omeprazole 40 MG capsule Commonly known as: PRILOSEC Stopped by: Reather Littler, MD   oxyCODONE 5 MG/5ML solution Commonly known as: ROXICODONE Stopped by: Reather Littler, MD     TAKE these medications   ALPRAZolam 0.5 MG tablet Commonly known as:  XANAX Take 0.5 mg by mouth at bedtime as needed for anxiety.   butalbital-acetaminophen-caffeine 50-325-40 MG tablet Commonly known as: FIORICET Take 1 tablet by mouth every 4 (four) hours as needed for headache or migraine.   ergocalciferol 1.25 MG (50000 UT) capsule Commonly known as: VITAMIN D2 Take 50,000 Units by mouth 2 (two) times a week.   ferrous sulfate 324 MG Tbec Take 324 mg by mouth daily.   pyridoxine 100 MG tablet Commonly known as: B-6 Take 100 mg by mouth daily.   rosuvastatin 10 MG tablet Commonly known as: CRESTOR Take 10 mg by mouth daily.   thyroid 90 MG tablet Commonly known as: ARMOUR Take 90 mg by mouth every morning.   vitamin B-12 500 MCG tablet Commonly known as: CYANOCOBALAMIN Take 500 mcg by mouth daily.          Review of Systems  Constitutional: Positive for weight gain.       She had lost a total of 100 pounds after her gastric bypass surgery and was down to 170 pounds.  Has recently been progressively gaining weight gradually because of having to loosen the gastric band  HENT: Positive for headaches.        Migraine  Respiratory: Negative for shortness of breath.   Cardiovascular: Positive for palpitations.  Gastrointestinal: Positive for abdominal pain.       She gets some discomfort in the left lower abdomen.  She has regular bowel movements daily, previously had constipation  Endocrine: Positive for menstrual changes, cold intolerance and heat intolerance.       She previously had endometriosis causing excessive bleeding and now has minimal menstrual bleeding with having chemical ablation and using Mirena She gets alternatively hot and cold  Musculoskeletal: Negative for joint pain.  Skin: Negative for rash.  Neurological:       Sometimes will feel shaky.  No tingling or numbness in her extremities  Psychiatric/Behavioral: Positive for nervousness.              Examination:    BP 124/80   Pulse 90   Ht 5\' 8"  (1.727 m)    Wt 226 lb (102.5 kg)   SpO2 97%   BMI 34.36 kg/m   GENERAL:  Large build.  She has mild generalized obesity No cushingoid features of facial plethora, buffalo hump or supraclavicular fat pad  No pallor.    Skin:  no rash, purplish striae or any skin lesions. She has mild facial hirsutism on the upper lip and chin area Her hands feel cool to touch  EYES:  No prominence of the eyes or swelling of the eyelids  ENT: Oral  mucosa and tongue normal.  NECK: No lymphadenopathy  THYROID:  Not palpable.  HEART:  Normal  S1 and S2; no murmur or click.  CHEST:    Lungs: Vescicular breath sounds heard equally.  No crepitations/ wheeze.  ABDOMEN:  No distention.  Liver and spleen not palpable.  No other mass or tenderness.  NEUROLOGICAL: Reflexes are showing normal Relaxation at ankles and at biceps.  EXTREMITIES:  Normal peripheral joints.  No ankle edema present   Assessment:  Hashimoto's thyroiditis by history. Unclear if she truly has hypothyroidism either primary or secondary and currently no previous records are available She has a very long history of fatigue even prior to her starting thyroid supplements making it less likely that she truly initially had hypothyroidism  Currently she has had significant fatigue and somnolence similar to symptoms she had 20 years ago despite normal TSH levels Also has had increased anxiety and some difficulty with normal sleep patterns Also symptoms of difficulty with memory and attention deficit are likely to be related to hypothyroidism  HIRSUTISM: This will need to be evaluated to rule out PCOS or adrenal androgen excess  Anxiety: Currently only treated with as needed alprazolam  PLAN:   We will try to obtain prior TSH levels Check free T4, free T3 and TSH Consider using liothyronine and levothyroxine separately if her free T4 and free T3 levels are not in therapeutic range  Check DHEA-S and free testosterone to evaluate  hirsutism  Follow-up in 6 weeks if any dosage adjustment needs to be made   Elayne Snare 02/10/2020, 8:50 AM   Consultation note copy sent to the PCP  Note: This office note was prepared with Dragon voice recognition system technology. Any transcriptional errors that result from this process are unintentional.

## 2020-02-10 NOTE — Telephone Encounter (Signed)
Per Dr. Lucianne Muss:  Please ask Boyd Kerbs to call [facility] for last 3 years TSH levels.  Routed message to [facility] for her to address and obtain.

## 2020-02-12 ENCOUNTER — Other Ambulatory Visit: Payer: Self-pay

## 2020-02-12 ENCOUNTER — Other Ambulatory Visit: Payer: Self-pay | Admitting: Endocrinology

## 2020-02-12 DIAGNOSIS — E038 Other specified hypothyroidism: Secondary | ICD-10-CM

## 2020-02-12 DIAGNOSIS — R5383 Other fatigue: Secondary | ICD-10-CM

## 2020-02-12 DIAGNOSIS — R635 Abnormal weight gain: Secondary | ICD-10-CM

## 2020-02-12 MED ORDER — LIOTHYRONINE SODIUM 5 MCG PO TABS: ORAL_TABLET | ORAL | 2 refills | Status: DC

## 2020-02-12 MED ORDER — LEVOTHYROXINE SODIUM 137 MCG PO TABS: 137.0000 ug | ORAL_TABLET | Freq: Every day | ORAL | 2 refills | Status: DC

## 2020-02-13 LAB — TESTOSTERONE, FREE, TOTAL, SHBG
Sex Hormone Binding: 54.2 nmol/L (ref 24.6–122.0)
Testosterone, Free: 1.7 pg/mL (ref 0.0–4.2)
Testosterone: 13 ng/dL (ref 8–48)

## 2020-02-13 LAB — DHEA-SULFATE: DHEA-SO4: 95.3 ug/dL (ref 57.3–279.2)

## 2020-02-13 LAB — PROLACTIN: Prolactin: 18.4 ng/mL (ref 4.8–23.3)

## 2020-02-27 LAB — SPECIMEN STATUS REPORT

## 2020-03-06 LAB — INSULIN-LIKE GROWTH FACTOR: Insulin-Like GF-1: 213 ng/mL (ref 79–259)

## 2020-03-06 LAB — SPECIMEN STATUS REPORT

## 2020-03-19 ENCOUNTER — Other Ambulatory Visit: Payer: Self-pay

## 2020-03-23 ENCOUNTER — Encounter: Payer: Self-pay | Admitting: Endocrinology

## 2020-03-23 ENCOUNTER — Other Ambulatory Visit: Payer: Self-pay

## 2020-03-23 ENCOUNTER — Ambulatory Visit: Payer: BC Managed Care – PPO | Admitting: Endocrinology

## 2020-03-23 VITALS — BP 126/84 | HR 96 | Ht 68.0 in | Wt 225.4 lb

## 2020-03-23 DIAGNOSIS — R635 Abnormal weight gain: Secondary | ICD-10-CM | POA: Diagnosis not present

## 2020-03-23 DIAGNOSIS — R5383 Other fatigue: Secondary | ICD-10-CM | POA: Diagnosis not present

## 2020-03-23 DIAGNOSIS — E038 Other specified hypothyroidism: Secondary | ICD-10-CM

## 2020-03-23 NOTE — Addendum Note (Signed)
Addended by: Adline Mango I on: 03/23/2020 11:07 AM   Modules accepted: Orders

## 2020-03-23 NOTE — Progress Notes (Addendum)
Patient ID: Cynthia Webb, female   DOB: 01-15-1981, 39 y.o.   MRN: [id number]            Referring Provider: Dr. Ardelle Park, Leona Carry  Reason for Appointment:  Hypothyroidism, follow-up visit    History of Present Illness:   Hypothyroidism was first diagnosed in 2011  RECENT HISTORY:  Since she was having significant fatigue with Armour Thyroid she has been changed to levothyroxine and liothyronine separately on her initial consultation in 3/21 She is now taking 10 mcg of liothyronine along with 137 mcg of levothyroxine She said that she is overall feeling better but still has significant fatigability She does feel clearheaded now and less difficulty with concentration However she gets tired easily and can only feel good intermittently during the day, takes a couple of hours to get going in the morning  No weight change, previously was concerned about progressively gaining weight  She is regular with taking her supplement before breakfast No recent labs available, previously TSH was 2.0  PRIOR history on initial consultation as follows:  At the time of diagnosis patient was having symptoms of   extreme fatigue, somnolence, feeling of brain fog and does not remember other symptoms.  She thinks she was having symptoms for 10 years She says she was told her symptoms resembled hypothyroidism but her tests would be normal No details of previous labs are available  However the patient was started on Synthroid treatment by her PCP because she was found to have antibody tests for Hashimoto's disease She did not significantly feel any better with taking Synthroid and a couple of years later was switched to Armour Thyroid.  She thinks that with Armour Thyroid her fatigue was better but not normal         The patient has been treated with  various doses of Armour Thyroid and she thinks that about 1 to 1-1/2 years ago the dose was increased from 90 up to 120 mg daily.  She may have felt a little  better with the dosage increase  In the last 3 to 4 months she has had increasing fatigue and sleepiness.  She tries to be active but she tends to get tired easily She will take naps on the weekend No new symptoms of significant weight gain, cold intolerance or constipation She said that she feels she has difficulty with memory, absorbing details when she is studying and has to make notes.  She feels like she has brain fog. She thinks that she is having generalized swelling and feels bloated.  She also thinks that her tongue is relatively large  Her TSH in 2020 was normal TSH in 12/2019 was 2.0 Free T4 and free T3 levels are not available  She is on a trial of liothyronine 10 mcg and levothyroxine 137 mcg, she feels a little better overall but still has some fatigue and brain fog         Patient's weight history is as follows:  Wt Readings from Last 3 Encounters:  03/23/20 225 lb 6.4 oz (102.2 kg)  02/10/20 226 lb (102.5 kg)  06/06/16 208 lb (94.3 kg)    Thyroid function results have been as follows:  Lab Results  Component Value Date   TSH 0.86 02/10/2020   FREET4 0.52 (L) 02/10/2020   T3FREE 3.2 02/10/2020    No thyroid ultrasound reports available, apparently she was previously found to have stable thyroid nodules as of about 3 years ago   Past Medical History:  Diagnosis Date  . Anxiety   . Complication of anesthesia    panic when wakes up   . Depression   . Hyperlipidemia   . Hypothyroidism     Past Surgical History:  Procedure Laterality Date  . KNEE SURGERY    . LAPAROSCOPIC GASTRIC BANDING WITH HIATAL HERNIA REPAIR  08/17/2015   Procedure: LAPAROSCOPIC GASTRIC BANDING WITH HIATAL HERNIA REPAIR;  Surgeon: Greer Pickerel, MD;  Location: [facility];  Service: General;;    Family History  Problem Relation Age of Onset  . Diabetes Mother   . Hashimoto's thyroiditis Mother      pots  Social History:  reports that she has never smoked. She has never used smokeless  tobacco. She reports current alcohol use. She reports that she does not use drugs.  Allergies: No Known Allergies  Allergies as of 03/23/2020   No Known Allergies     Medication List       Accurate as of Mar 23, 2020 10:42 AM. If you have any questions, ask your nurse or doctor.        STOP taking these medications   ferrous sulfate 324 MG Tbec Stopped by: Elayne Snare, MD     TAKE these medications   ALPRAZolam 1 MG tablet Commonly known as: XANAX Take 1 mg by mouth at bedtime as needed for anxiety. What changed: Another medication with the same name was removed. Continue taking this medication, and follow the directions you see here. Changed by: Elayne Snare, MD   butalbital-acetaminophen-caffeine [phone] MG tablet Commonly known as: FIORICET Take 1 tablet by mouth every 4 (four) hours as needed for headache or migraine.   ergocalciferol 1.25 MG (50000 UT) capsule Commonly known as: VITAMIN D2 Take 50,000 Units by mouth 2 (two) times a week.   levothyroxine 137 MCG tablet Commonly known as: SYNTHROID Take 1 tablet (137 mcg total) by mouth daily before breakfast.   liothyronine 5 MCG tablet Commonly known as: CYTOMEL Take 2 tablets (65mcg total) by mouth once daily in the morning.   pyridoxine 100 MG tablet Commonly known as: B-6 Take 100 mg by mouth daily.   rosuvastatin 10 MG tablet Commonly known as: CRESTOR Take 10 mg by mouth daily.   vitamin B-12 500 MCG tablet Commonly known as: CYANOCOBALAMIN Take 500 mcg by mouth daily.          Review of Systems   Hirsutism and menstrual irregularities: Her androgen levels were normal  She has history of syncope and may have dysautonomia, currently being evaluated by a cardiologist at [facility] Recently seen and has been asked to do extensive lab work           Examination:    BP 126/84 (BP Location: Left Arm, Patient Position: Sitting, Cuff Size: Normal)   Pulse 96   Ht 5\' 8"  (1.727 m)   Wt 225 lb 6.4 oz  (102.2 kg)   SpO2 98%   BMI 34.27 kg/m   She has minimal alopecia on vertex Biceps reflexes appear normal  Assessment:  Hashimoto's thyroiditis and hypothyroidism On relatively large doses of thyroid supplements but not clear what her baseline levels were prior to starting thyroid supplementation  Currently with using levothyroxine and liothyronine her symptoms are slightly better especially mental clarity and slight improvement in her fatigue and tendency to weight gain This is with increasing her levothyroxine compared to the liothyronine  We will try to optimize her thyroid levels including free T4 and T3 and maintain TSH around  1-2 for subjective improvement  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  Safety protocols were in place, including screening questions prior to the visit, additional usage of staff PPE, and extensive cleaning of exam room while observing appropriate contact time as indicated for disinfecting solutions.     Note: This office note was prepared with Insurance underwriter. Any transcriptional errors that result from this process are unintentional.   Addendum: T3 level is high at 4.6 with low TSH, will reduce liothyronine to 1 tablet instead of 2 Dimitrius Steedman Lucianne Muss

## 2020-03-24 LAB — T3, FREE: T3, Free: 4.6 pg/mL — ABNORMAL HIGH (ref 2.0–4.4)

## 2020-03-24 LAB — INSULIN-LIKE GROWTH FACTOR: Insulin-Like GF-1: 234 ng/mL (ref 79–259)

## 2020-03-24 LAB — T4, FREE: Free T4: 1.5 ng/dL (ref 0.82–1.77)

## 2020-03-24 LAB — TSH: TSH: 0.02 u[IU]/mL — ABNORMAL LOW (ref 0.450–4.500)

## 2020-03-31 ENCOUNTER — Other Ambulatory Visit: Payer: Self-pay | Admitting: Endocrinology

## 2020-04-05 ENCOUNTER — Other Ambulatory Visit: Payer: Self-pay | Admitting: *Deleted

## 2020-04-05 ENCOUNTER — Telehealth: Payer: Self-pay | Admitting: Endocrinology

## 2020-04-05 DIAGNOSIS — E038 Other specified hypothyroidism: Secondary | ICD-10-CM

## 2020-04-05 NOTE — Telephone Encounter (Signed)
Spoke to pt asked her if she wanted her last labs sent over to [facility]? No sure about message. Pt said no, she wanted to know if she could have her thyroid labs repeated there instead of coming here. She said her appt is June 11th. Told pt that is too soon to have thyroid lab repeated needs to be around June 22nd. Told pt she can just schedule lab appt only I will put orders in. Pt verbalized understanding and will call back to schedule. Orders put in Epic.

## 2020-04-05 NOTE — Telephone Encounter (Signed)
Patient requests that lab orders from  Dr. Lucianne Muss be sent to [facility]-Dr. Mobarek-ph# [phone] (patient was unable to provide fax#). Patient having labs done there and prefers not to take another day off work to get her labs done here.  Patient is considering laser fat removal at [facility] and would like to be called at ph# [phone] to be advised about if it possibly cause any additional problems with patient's thyroid.

## 2020-04-20 ENCOUNTER — Telehealth: Payer: Self-pay | Admitting: Endocrinology

## 2020-04-20 NOTE — Telephone Encounter (Signed)
Patient called to find out if she could have the labs requested by Dr Lucianne Muss done at her PCP visit on Friday 04/23/2020.  Please contact her at [phone] - per patient OK to leave message

## 2020-04-20 NOTE — Telephone Encounter (Signed)
Called pt and left detailed voicemail informing her that the bloodwork can be collected anywhere she would like to have it done at, as long as this office receives a copy by her appt date.

## 2020-05-25 ENCOUNTER — Encounter: Payer: Self-pay | Admitting: Endocrinology

## 2020-05-25 ENCOUNTER — Other Ambulatory Visit: Payer: Self-pay

## 2020-05-25 ENCOUNTER — Ambulatory Visit: Payer: BC Managed Care – PPO | Admitting: Endocrinology

## 2020-05-25 DIAGNOSIS — E038 Other specified hypothyroidism: Secondary | ICD-10-CM

## 2020-05-25 LAB — TSH: TSH: 0.04 u[IU]/mL — ABNORMAL LOW (ref 0.35–4.50)

## 2020-05-25 LAB — T3, FREE: T3, Free: 3.5 pg/mL (ref 2.3–4.2)

## 2020-05-25 LAB — T4, FREE: Free T4: 1.02 ng/dL (ref 0.60–1.60)

## 2020-05-25 NOTE — Progress Notes (Signed)
Thyroid level is still high, will need new prescription for levothyroxine 125 mcg daily.  Continue liothyronine 5 mcg daily as before and keep appointment in September

## 2020-05-25 NOTE — Progress Notes (Addendum)
Patient ID: Cynthia Webb, female   DOB: 16-Sep-1981, 39 y.o.   MRN: [id number]            Referring Provider: Dr. Donnel Saxon   Reason for Appointment:  Hypothyroidism, follow-up visit    History of Present Illness:   Hypothyroidism was first diagnosed in 2011  RECENT HISTORY:  Since she was having significant fatigue with Armour Thyroid she has been changed to levothyroxine and liothyronine separately on her initial consultation in 3/21 She is now taking 5 mcg of liothyronine along with 137 mcg of levothyroxine, the liothyronine dose was reduced in 03/23/2020 Her energy level is continuing to improve especially after the reduction in liothyronine She is taking less naps during the day and not needing to sleep as much otherwise Her concentration is improved Occasionally will feel hot but other times:  She thinks she is retaining fluid and gaining weight  She is regular with taking her supplements before breakfast No recent labs available, previously TSH was normal on the previous dose  PRIOR history on initial consultation as follows:  At the time of diagnosis patient was having symptoms of   extreme fatigue, somnolence, feeling of brain fog and does not remember other symptoms.  She thinks she was having symptoms for 10 years She says she was told her symptoms resembled hypothyroidism but her tests would be normal No details of previous labs are available  However the patient was started on Synthroid treatment by her PCP because she was found to have antibody tests for Hashimoto's disease She did not significantly feel any better with taking Synthroid and a couple of years later was switched to Armour Thyroid.  She thinks that with Armour Thyroid her fatigue was better but not normal         The patient has been treated with  various doses of Armour Thyroid and she thinks that about 1 to 1-1/2 years ago the dose was increased from 90 up to 120 mg daily.  She may have felt a  little better with the dosage increase  In the last 3 to 4 months she has had increasing fatigue and sleepiness.  She tries to be active but she tends to get tired easily She will take naps on the weekend  She said that she feels she has difficulty with memory, absorbing details when she is studying and has to make notes.  She feels like she has brain fog. Had complaints of having swelling and feels bloated.  Her TSH in 2020 was normal TSH in 12/2019 was 2.0          Patient's weight history is as follows:  Wt Readings from Last 3 Encounters:  05/25/20 237 lb 9.6 oz (107.8 kg)  03/23/20 225 lb 6.4 oz (102.2 kg)  02/10/20 226 lb (102.5 kg)    Thyroid function results have been as follows:  Lab Results  Component Value Date   TSH 0.020 (L) 03/23/2020   TSH 0.86 02/10/2020   FREET4 1.50 03/23/2020   FREET4 0.52 (L) 02/10/2020   T3FREE 4.6 (H) 03/23/2020   T3FREE 3.2 02/10/2020    No thyroid ultrasound reports available, apparently she was previously found to have stable thyroid nodules as of about 3 years ago   Past Medical History:  Diagnosis Date  . Anxiety   . Complication of anesthesia    panic when wakes up   . Depression   . Hyperlipidemia   . Hypothyroidism     Past Surgical History:  Procedure Laterality Date  . KNEE SURGERY    . LAPAROSCOPIC GASTRIC BANDING WITH HIATAL HERNIA REPAIR  08/17/2015   Procedure: LAPAROSCOPIC GASTRIC BANDING WITH HIATAL HERNIA REPAIR;  Surgeon: Gaynelle Adu, MD;  Location: [facility];  Service: General;;    Family History  Problem Relation Age of Onset  . Diabetes Mother   . Hashimoto's thyroiditis Mother      pots  Social History:  reports that she has never smoked. She has never used smokeless tobacco. She reports current alcohol use. She reports that she does not use drugs.  Allergies: No Known Allergies  Allergies as of 05/25/2020   No Known Allergies     Medication List       Accurate as of May 25, 2020  8:24 AM.  If you have any questions, ask your nurse or doctor.        ALPRAZolam 1 MG tablet Commonly known as: XANAX Take 1 mg by mouth at bedtime as needed for anxiety.   butalbital-acetaminophen-caffeine 50-325-40 MG tablet Commonly known as: FIORICET Take 1 tablet by mouth every 4 (four) hours as needed for headache or migraine.   ergocalciferol 1.25 MG (50000 UT) capsule Commonly known as: VITAMIN D2 Take 50,000 Units by mouth 2 (two) times a week.   levothyroxine 137 MCG tablet Commonly known as: SYNTHROID TAKE 1 TABLET (137 MCG TOTAL) BY MOUTH DAILY BEFORE BREAKFAST.   liothyronine 5 MCG tablet Commonly known as: CYTOMEL Take 5 mcg by mouth daily. What changed: Another medication with the same name was removed. Continue taking this medication, and follow the directions you see here. Changed by: Reather Littler, MD   pyridoxine 100 MG tablet Commonly known as: B-6 Take 100 mg by mouth daily.   rosuvastatin 10 MG tablet Commonly known as: CRESTOR Take 10 mg by mouth daily.   vitamin B-12 500 MCG tablet Commonly known as: CYANOCOBALAMIN Take 500 mcg by mouth daily.          Review of Systems  Hirsutism and menstrual irregularities: Her androgen levels were normal  She has history of syncope and may have dysautonomia, her tilt table test was possibly positive with relatively higher pulse rate but no drop in blood pressure at 70 degrees.  She has been only recommended high sodium low carbohydrate diet and possibly may go on beta-blockers            Examination:    BP 122/82 (BP Location: Left Arm, Patient Position: Sitting, Cuff Size: Large)   Pulse 82   Ht 5\' 8"  (1.727 m)   Wt 237 lb 9.6 oz (107.8 kg)   SpO2 96%   BMI 36.13 kg/m   Thyroid not palpable Biceps reflexes appear normal  Assessment:  Hashimoto's thyroiditis and hypothyroidism  Currently is feeling fairly good subjectively Exam is unremarkable  PLAN:    Check free T4, free T3 and TSH today We  will decide on her dosage based on labs Likely needs to increase her exercise level and work with her PCP regarding symptoms of increased hunger possibly from increased anxiety at times  If labs are normal she can come back in 4 months for follow-up   05/25/2020, 8:24 AM    This visit occurred during the SARS-CoV-2 public health emergency.  Safety protocols were in place, including screening questions prior to the visit, additional usage of staff PPE, and extensive cleaning of exam room while observing appropriate contact time as indicated for disinfecting solutions.  Note: This office note was prepared with Insurance underwriter. Any transcriptional errors that result from this process are unintentional.   Addendum: Thyroid level is still high, will need new prescription for levothyroxine 125 mcg daily.  Continue liothyronine 5 mcg daily as before and keep appointment in September  Reather Littler

## 2020-05-26 ENCOUNTER — Other Ambulatory Visit: Payer: Self-pay | Admitting: *Deleted

## 2020-05-26 MED ORDER — LEVOTHYROXINE SODIUM 125 MCG PO TABS: 125.0000 ug | ORAL_TABLET | Freq: Every day | ORAL | 0 refills | Status: DC

## 2020-07-27 NOTE — Progress Notes (Signed)
Patient ID: Cynthia Webb, female   DOB: 1981-02-11, 39 y.o.   MRN: [id number]            Referring Provider: Dr. Donnel Saxon   Reason for Appointment:  Hypothyroidism, follow-up visit    History of Present Illness:   Hypothyroidism was first diagnosed in 2011  RECENT HISTORY:  Since she was having significant fatigue with Armour Thyroid she has been changed to levothyroxine and liothyronine separately on her initial consultation in 3/21 She is now taking 5 mcg of liothyronine along with 137 mcg of levothyroxine, the liothyronine dose was reduced in 03/23/2020 Her energy level is continuing to improve especially after the reduction in liothyronine She is taking less naps during the day and not needing to sleep as much otherwise Her concentration is improved Occasionally will feel hot but other times:  She thinks she is retaining fluid and gaining weight  She is regular with taking her supplements before breakfast No recent labs available, previously TSH was normal on the previous dose  PRIOR history on initial consultation as follows:  At the time of diagnosis patient was having symptoms of   extreme fatigue, somnolence, feeling of brain fog and does not remember other symptoms.  She thinks she was having symptoms for 10 years She says she was told her symptoms resembled hypothyroidism but her tests would be normal No details of previous labs are available  Currently the patient was started on Synthroid treatment by her PCP because she was found to have antibody tests for Hashimoto's disease She did not significantly feel any better with taking Synthroid and a couple of years later was switched to Armour Thyroid.  She thinks that with Armour Thyroid her fatigue was better but not normal Prior to her initial consultation about 1 to 1-1/2 years before the dose was increased from 90 up to 120 mg daily.  She may have felt a little better with the dosage increase  RECENT  HISTORY: About 3 to 4 months before her first visit she has had increasing fatigue and sleepiness.  She would take naps on the weekend Also reported difficulty with memory, absorbing details when she is studying and has to make notes.  She feels like she has brain fog. Had complaints of having swelling and feels bloated.  She was switched to levothyroxine and liothyronine Although initially her labs were normal except for low free T4 she appears to be requiring lower doses of levothyroxine since her first visit in 3/21  She is now taking 125 mcg levothyroxine along with 5 mcg liothyronine More recently she has had some good days where she has fairly good energy level followed by some days when she is tired, lethargic and sleepy. She tries to exercise on the days she feels good. Does not complain of brain fog but occasionally has difficulty focusing She is concerned about lack of weight loss  Her thyroid levels on the last visit showed suppressed TSH with normal T4 and T3         Patient's weight history is as follows:  Wt Readings from Last 3 Encounters:  07/28/20 235 lb 9.6 oz (106.9 kg)  05/25/20 237 lb 9.6 oz (107.8 kg)  03/23/20 225 lb 6.4 oz (102.2 kg)    Thyroid function results have been as follows:  Lab Results  Component Value Date   TSH 0.04 (L) 05/25/2020   TSH 0.020 (L) 03/23/2020   TSH 0.86 02/10/2020   FREET4 1.02 05/25/2020   FREET4 1.50  03/23/2020   FREET4 0.52 (L) 02/10/2020   T3FREE 3.5 05/25/2020   T3FREE 4.6 (H) 03/23/2020   T3FREE 3.2 02/10/2020    No thyroid ultrasound reports available, apparently she was previously found to have stable thyroid nodules as of about 3 years ago   Past Medical History:  Diagnosis Date  . Anxiety   . Complication of anesthesia    panic when wakes up   . Depression   . Hyperlipidemia   . Hypothyroidism     Past Surgical History:  Procedure Laterality Date  . KNEE SURGERY    . LAPAROSCOPIC GASTRIC BANDING WITH  HIATAL HERNIA REPAIR  08/17/2015   Procedure: LAPAROSCOPIC GASTRIC BANDING WITH HIATAL HERNIA REPAIR;  Surgeon: Gaynelle Adu, MD;  Location: [facility];  Service: General;;    Family History  Problem Relation Age of Onset  . Diabetes Mother   . Hashimoto's thyroiditis Mother      pots  Social History:  reports that she has never smoked. She has never used smokeless tobacco. She reports current alcohol use. She reports that she does not use drugs.  Allergies: No Known Allergies  Allergies as of 07/28/2020   No Known Allergies     Medication List       Accurate as of July 28, 2020  8:36 AM. If you have any questions, ask your nurse or doctor.        ALPRAZolam 1 MG tablet Commonly known as: XANAX Take 1 mg by mouth at bedtime as needed for anxiety.   butalbital-acetaminophen-caffeine 50-325-40 MG tablet Commonly known as: FIORICET Take 1 tablet by mouth every 4 (four) hours as needed for headache or migraine.   ergocalciferol 1.25 MG (50000 UT) capsule Commonly known as: VITAMIN D2 Take 50,000 Units by mouth 2 (two) times a week.   levothyroxine 125 MCG tablet Commonly known as: SYNTHROID Take 1 tablet (125 mcg total) by mouth daily before breakfast.   liothyronine 5 MCG tablet Commonly known as: CYTOMEL Take 5 mcg by mouth daily.   pyridoxine 100 MG tablet Commonly known as: B-6 Take 100 mg by mouth daily.   rosuvastatin 10 MG tablet Commonly known as: CRESTOR Take 10 mg by mouth daily.   vitamin B-12 500 MCG tablet Commonly known as: CYANOCOBALAMIN Take 500 mcg by mouth daily.          Review of Systems  Hirsutism and menstrual irregularities: Her androgen levels were normal  She has history of syncope and may have dysautonomia, her tilt table test was possibly positive with relatively higher pulse rate but no drop in blood pressure at 70 degrees.  She has been only recommended high sodium low carbohydrate diet and possibly may go on beta-blockers             Examination:    BP 122/80 (BP Location: Left Arm, Patient Position: Sitting, Cuff Size: Normal)   Pulse 77   Ht 5\' 8"  (1.727 m)   Wt 235 lb 9.6 oz (106.9 kg)   SpO2 99%   BMI 35.82 kg/m    Assessment:  Hashimoto's thyroiditis and hypothyroidism  Currently taking levothyroxine 125 mcg with liothyronine 5 mcg; previously T3 levels have been normal but needing less levothyroxine because of suppressed TSH  She has had intermittent fatigue and unclear if this is related to hypothyroidism Overall she is functioning better especially with symptoms of brain fog Weight has leveled off  Not clear if her TSH needs to be normal since it is unclear whether  she has primary or secondary hypothyroidism  PLAN:    Check free T4, free T3 and TSH today Medication will be further adjusted  She may benefit with a weight loss medication since she is still reporting difficulty losing weight despite trying to be more active She will check with her insurance for coverage for Qsymia, Contrave and Saxenda.  Not clear if she may have worsening of anxiety with phentermine/Topamax combination  If labs are normal she can come back in 4 months for follow-up   Reather Littler 07/28/2020, 8:36 AM    This visit occurred during the SARS-CoV-2 public health emergency.  Safety protocols were in place, including screening questions prior to the visit, additional usage of staff PPE, and extensive cleaning of exam room while observing appropriate contact time as indicated for disinfecting solutions.     Note: This office note was prepared with Insurance underwriter. Any transcriptional errors that result from this process are unintentional.    Reather Littler

## 2020-07-28 ENCOUNTER — Encounter: Payer: Self-pay | Admitting: Endocrinology

## 2020-07-28 ENCOUNTER — Other Ambulatory Visit: Payer: Self-pay

## 2020-07-28 ENCOUNTER — Ambulatory Visit: Payer: BC Managed Care – PPO | Admitting: Endocrinology

## 2020-07-28 VITALS — BP 122/80 | HR 77 | Ht 68.0 in | Wt 235.6 lb

## 2020-07-28 DIAGNOSIS — E063 Autoimmune thyroiditis: Secondary | ICD-10-CM | POA: Diagnosis not present

## 2020-07-28 DIAGNOSIS — E038 Other specified hypothyroidism: Secondary | ICD-10-CM | POA: Diagnosis not present

## 2020-07-28 LAB — T4, FREE: Free T4: 0.78 ng/dL (ref 0.60–1.60)

## 2020-07-28 LAB — TSH: TSH: 0.36 u[IU]/mL (ref 0.35–4.50)

## 2020-07-28 LAB — T3, FREE: T3, Free: 3 pg/mL (ref 2.3–4.2)

## 2020-07-28 NOTE — Patient Instructions (Signed)
Qsymia, Contrave, Saxenda for wt loss

## 2020-07-29 NOTE — Progress Notes (Signed)
Please call to let patient know that the lab results are normal and no change needed

## 2020-08-22 ENCOUNTER — Other Ambulatory Visit: Payer: Self-pay | Admitting: Endocrinology

## 2020-09-17 ENCOUNTER — Other Ambulatory Visit: Payer: Self-pay | Admitting: *Deleted

## 2020-09-17 MED ORDER — LIOTHYRONINE SODIUM 5 MCG PO TABS: 5.0000 ug | ORAL_TABLET | Freq: Every day | ORAL | 1 refills | Status: DC

## 2020-12-01 ENCOUNTER — Ambulatory Visit: Payer: BC Managed Care – PPO | Admitting: Endocrinology

## 2020-12-28 ENCOUNTER — Ambulatory Visit: Payer: BC Managed Care – PPO | Admitting: Endocrinology

## 2020-12-28 ENCOUNTER — Encounter: Payer: Self-pay | Admitting: Endocrinology

## 2020-12-28 ENCOUNTER — Other Ambulatory Visit: Payer: Self-pay

## 2020-12-28 VITALS — BP 144/84 | HR 99 | Ht 68.0 in | Wt 233.4 lb

## 2020-12-28 DIAGNOSIS — E063 Autoimmune thyroiditis: Secondary | ICD-10-CM | POA: Diagnosis not present

## 2020-12-28 LAB — T4, FREE: Free T4: 0.77 ng/dL (ref 0.60–1.60)

## 2020-12-28 LAB — TSH: TSH: 0.95 u[IU]/mL (ref 0.35–4.50)

## 2020-12-28 LAB — T3, FREE: T3, Free: 3.2 pg/mL (ref 2.3–4.2)

## 2020-12-28 NOTE — Progress Notes (Signed)
Patient ID: Cynthia Webb, female   DOB: 05/31/1981, 40 y.o.   MRN: [id number]            Referring Provider: Dr. Donnel Saxon   Reason for Appointment:  Hypothyroidism, follow-up visit    History of Present Illness:   Hypothyroidism was first diagnosed in 2011  RECENT HISTORY:  Since she was having significant fatigue with Armour Thyroid she has been changed to levothyroxine and liothyronine separately on her initial consultation in 3/21 She is now taking 5 mcg of liothyronine along with 137 mcg of levothyroxine, the liothyronine dose was reduced in 03/23/2020 Her energy level is continuing to improve especially after the reduction in liothyronine She is taking less naps during the day and not needing to sleep as much otherwise Her concentration is improved Occasionally will feel hot but other times:  She thinks she is retaining fluid and gaining weight  She is regular with taking her supplements before breakfast No recent labs available, previously TSH was normal on the previous dose  PRIOR history on initial consultation as follows:  At the time of diagnosis patient was having symptoms of   extreme fatigue, somnolence, feeling of brain fog and does not remember other symptoms.  She thinks she was having symptoms for 10 years She says she was told her symptoms resembled hypothyroidism but her tests would be normal No details of previous labs are available  The patient was started on Synthroid by her PCP because she was found to have antibody tests for Hashimoto's disease She did not significantly feel any better with taking Synthroid and a couple of years later was switched to Armour Thyroid.  She thinks that with Armour Thyroid her fatigue was better but not normal Prior to her initial consultation about 1 to 1-1/2 years before the dose was increased from 90 up to 120 mg daily.  She may have felt a little better with the dosage increase  RECENT HISTORY: About 3 to 4 months  before her first visit she had increasing fatigue and sleepiness.  She would take naps on the weekend Also reported difficulty with memory, absorbing details when she is studying and has to make notes.  She feels like she has brain fog. Had complaints of having swelling and feels bloated.  She was switched to levothyroxine and liothyronine Subsequently has had less of the above symptoms  She is now taking 125 mcg levothyroxine along with 5 mcg liothyronine in the mornings She has had good energy level overall although has had less stress also No recent difficulty with focusing She has persistent hair loss but this is not any worse She is concerned about lack of weight loss and apparently she was given a sample of Wegovy and she lost 6 pounds with it but is waiting for insurance authorization  Her thyroid levels on the last visit showed normalization of TSH and the dose was continued unchanged, previously TSH had been low twice         Patient's weight history is as follows:  Wt Readings from Last 3 Encounters:  12/28/20 233 lb 6.4 oz (105.9 kg)  07/28/20 235 lb 9.6 oz (106.9 kg)  05/25/20 237 lb 9.6 oz (107.8 kg)    Thyroid function results have been as follows:  Lab Results  Component Value Date   TSH 0.36 07/28/2020   TSH 0.04 (L) 05/25/2020   TSH 0.020 (L) 03/23/2020   TSH 0.86 02/10/2020   FREET4 0.78 07/28/2020   FREET4 1.02 05/25/2020  FREET4 1.50 03/23/2020   FREET4 0.52 (L) 02/10/2020   T3FREE 3.0 07/28/2020   T3FREE 3.5 05/25/2020   T3FREE 4.6 (H) 03/23/2020    No thyroid ultrasound reports available, apparently she was previously found to have stable thyroid nodules as of about 3 years ago   Past Medical History:  Diagnosis Date  . Anxiety   . Complication of anesthesia    panic when wakes up   . Depression   . Hyperlipidemia   . Hypothyroidism     Past Surgical History:  Procedure Laterality Date  . KNEE SURGERY    . LAPAROSCOPIC GASTRIC BANDING  WITH HIATAL HERNIA REPAIR  08/17/2015   Procedure: LAPAROSCOPIC GASTRIC BANDING WITH HIATAL HERNIA REPAIR;  Surgeon: Gaynelle Adu, MD;  Location: [facility];  Service: General;;    Family History  Problem Relation Age of Onset  . Diabetes Mother   . Hashimoto's thyroiditis Mother      pots  Social History:  reports that she has never smoked. She has never used smokeless tobacco. She reports current alcohol use. She reports that she does not use drugs.  Allergies: No Known Allergies  Allergies as of 12/28/2020   No Known Allergies     Medication List       Accurate as of December 28, 2020  9:12 AM. If you have any questions, ask your nurse or doctor.        ALPRAZolam 1 MG tablet Commonly known as: XANAX Take 1 mg by mouth at bedtime as needed for anxiety.   butalbital-acetaminophen-caffeine 50-325-40 MG tablet Commonly known as: FIORICET Take 1 tablet by mouth every 4 (four) hours as needed for headache or migraine.   ergocalciferol 1.25 MG (50000 UT) capsule Commonly known as: VITAMIN D2 Take 50,000 Units by mouth 2 (two) times a week.   Vitamin D (Ergocalciferol) 1.25 MG (50000 UNIT) Caps capsule Commonly known as: DRISDOL 1 capsule   levothyroxine 125 MCG tablet Commonly known as: SYNTHROID TAKE 1 TABLET DAILY BEFORE BREAKFAST   liothyronine 5 MCG tablet Commonly known as: CYTOMEL Take 1 tablet (5 mcg total) by mouth daily.   pyridoxine 100 MG tablet Commonly known as: B-6 Take 100 mg by mouth daily.   rosuvastatin 10 MG tablet Commonly known as: CRESTOR Take 10 mg by mouth daily.   vitamin B-12 500 MCG tablet Commonly known as: CYANOCOBALAMIN Take 500 mcg by mouth daily.          Review of Systems  Hirsutism and menstrual irregularities: Her androgen levels were normal  She has history of syncope and may have dysautonomia, her tilt table test was possibly positive with relatively higher pulse rate but no drop in blood pressure at 70 degrees.  She  has been only recommended high sodium low carbohydrate diet and possibly may go on beta-blockers            Examination:    BP (!) 144/84   Pulse 99   Ht 5\' 8"  (1.727 m)   Wt 233 lb 6.4 oz (105.9 kg)   SpO2 99%   BMI 35.49 kg/m   Mild frontal alopecia present  Thyroid not palpable Biceps reflexes appear normal  Assessment:  Hashimoto's thyroiditis and hypothyroidism  She is taking levothyroxine 125 mcg with liothyronine 5 mcg In 2021 she had been told to reduce her levothyroxine because of low TSH  Subjectively doing better but also has had less stress and likely feels better with some weight loss  Has had some chronic  hair loss  PLAN:    Check free T4, free T3 and TSH today Dosage regimen will be adjusted if needed Suggested to her that she can take levothyroxine and liothyronine both together on waking up rather than separately  If labs are normal she can come back in 6 months   Reather Littler 12/28/2020, 9:12 AM    This visit occurred during the SARS-CoV-2 public health emergency.  Safety protocols were in place, including screening questions prior to the visit, additional usage of staff PPE, and extensive cleaning of exam room while observing appropriate contact time as indicated for disinfecting solutions.     Note: This office note was prepared with Insurance underwriter. Any transcriptional errors that result from this process are unintentional.    Reather Littler

## 2021-02-15 ENCOUNTER — Other Ambulatory Visit: Payer: Self-pay | Admitting: Endocrinology

## 2021-06-16 ENCOUNTER — Telehealth: Payer: Self-pay | Admitting: Endocrinology

## 2021-06-16 MED ORDER — LEVOTHYROXINE SODIUM 125 MCG PO TABS: 125.0000 ug | ORAL_TABLET | Freq: Every day | ORAL | 3 refills | Status: DC

## 2021-06-16 NOTE — Telephone Encounter (Signed)
Rx sent to preferred pharmacy.

## 2021-06-16 NOTE — Telephone Encounter (Signed)
MEDICATION: Levothyroxine  PHARMACY:  [facility] in [place] (if only for 30 days)   [facility] (if for 90 days)  HAS THE PATIENT CONTACTED THEIR PHARMACY? Yes - Monday 06/13/21   IS THIS A 90 DAY SUPPLY : see above  IS PATIENT OUT OF MEDICATION: yes  IF NOT; HOW MUCH IS LEFT: 0  LAST APPOINTMENT DATE: @4 /03/2021  NEXT APPOINTMENT DATE:@8 /22/2022  DO WE HAVE YOUR PERMISSION TO LEAVE A DETAILED MESSAGE?: yes

## 2021-06-24 ENCOUNTER — Other Ambulatory Visit: Payer: Self-pay

## 2021-06-24 DIAGNOSIS — E038 Other specified hypothyroidism: Secondary | ICD-10-CM

## 2021-06-24 MED ORDER — LEVOTHYROXINE SODIUM 125 MCG PO TABS: 125.0000 ug | ORAL_TABLET | Freq: Every day | ORAL | 3 refills | Status: DC

## 2021-07-04 ENCOUNTER — Ambulatory Visit: Payer: BC Managed Care – PPO | Admitting: Endocrinology

## 2021-07-04 ENCOUNTER — Encounter: Payer: Self-pay | Admitting: Endocrinology

## 2021-07-04 ENCOUNTER — Other Ambulatory Visit: Payer: Self-pay

## 2021-07-04 VITALS — BP 150/84 | HR 119 | Ht 67.0 in | Wt 235.6 lb

## 2021-07-04 DIAGNOSIS — E063 Autoimmune thyroiditis: Secondary | ICD-10-CM

## 2021-07-04 LAB — T3, FREE: T3, Free: 3.3 pg/mL (ref 2.3–4.2)

## 2021-07-04 LAB — TSH: TSH: 0.13 u[IU]/mL — ABNORMAL LOW (ref 0.35–5.50)

## 2021-07-04 LAB — T4, FREE: Free T4: 0.91 ng/dL (ref 0.60–1.60)

## 2021-07-04 NOTE — Progress Notes (Signed)
Patient ID: Cynthia Webb, female   DOB: [birth year], 40 y.o.   MRN: [id number]            Referring Provider: Dr. Donnel Saxon   Reason for Appointment:  Hypothyroidism, follow-up visit    History of Present Illness:   Hypothyroidism was first diagnosed in 2011   PRIOR history on initial consultation as follows:  At the time of diagnosis patient was having symptoms of   extreme fatigue, somnolence, feeling of brain fog and does not remember other symptoms.  She thinks she was having symptoms for 10 years She says she was told her symptoms resembled hypothyroidism but her tests would be normal No details of previous labs are available  The patient was started on Synthroid by her PCP because she was found to have antibody tests for Hashimoto's disease She did not significantly feel any better with taking Synthroid and a couple of years later was switched to Armour Thyroid.  She thinks that with Armour Thyroid her fatigue was better but not normal Prior to her initial consultation about 1 to 1-1/2 years before the dose was increased from 90 up to 120 mg daily.  She may have felt a little better with the dosage increase  RECENT HISTORY:  Since she was having significant fatigue with Armour Thyroid she has been changed to levothyroxine and liothyronine separately on her initial consultation in 3/21  Her baseline symptoms had been increasing fatigue and sleepiness.  She would take naps on the weekend Also reported difficulty with memory, absorbing details when she is studying and brain fog. Had complaints of having swelling and feels bloated.  She was switched to levothyroxine and liothyronine instead of Armour Thyroid with improvement in her symptoms  She is now taking 125 mcg levothyroxine along with 5 mcg liothyronine in the mornings  Although she has had generally good energy level about 3 weeks ago she started feeling more tired However has significant difficulty falling asleep  and only getting 3 to 5 hours of sleep at night now She does not think she has any increased anxiety, palpitations or shakiness  No recent difficulty with focusing She has persistent hair loss which is generally mild but on a couple of occasions has had significant amount of hair fall out  No recent weight change Previously she was given a sample of Wegovy from her PCP and she lost 6 pounds with it but apparently not able to get this through insurance  She is regular with taking her supplements before breakfast  Her thyroid levels on the last visit continued normal TSH as well as T4 and T3 levels          Patient's weight history is as follows:  Wt Readings from Last 3 Encounters:  07/04/21 235 lb 9.6 oz (106.9 kg)  12/28/20 233 lb 6.4 oz (105.9 kg)  07/28/20 235 lb 9.6 oz (106.9 kg)    Thyroid function results have been as follows:  Lab Results  Component Value Date   TSH 0.95 12/28/2020   TSH 0.36 07/28/2020   TSH 0.04 (L) 05/25/2020   TSH 0.020 (L) 03/23/2020   FREET4 0.77 12/28/2020   FREET4 0.78 07/28/2020   FREET4 1.02 05/25/2020   FREET4 1.50 03/23/2020   T3FREE 3.2 12/28/2020   T3FREE 3.0 07/28/2020   T3FREE 3.5 05/25/2020    No thyroid ultrasound reports available, apparently she was previously found to have stable thyroid nodules as of about 2019   Past Medical History:  Diagnosis  Date   Anxiety    Complication of anesthesia    panic when wakes up    Depression    Hyperlipidemia    Hypothyroidism     Past Surgical History:  Procedure Laterality Date   KNEE SURGERY     LAPAROSCOPIC GASTRIC BANDING WITH HIATAL HERNIA REPAIR  08/17/2015   Procedure: LAPAROSCOPIC GASTRIC BANDING WITH HIATAL HERNIA REPAIR;  Surgeon: Gaynelle Adu, MD;  Location: [facility];  Service: General;;    Family History  Problem Relation Age of Onset   Diabetes Mother    Hashimoto's thyroiditis Mother      pots  Social History:  reports that she has never smoked. She has never  used smokeless tobacco. She reports current alcohol use. She reports that she does not use drugs.  Allergies: No Known Allergies  Allergies as of 07/04/2021   No Known Allergies      Medication List        Accurate as of July 04, 2021  3:29 PM. If you have any questions, ask your nurse or doctor.          ALPRAZolam 1 MG tablet Commonly known as: XANAX Take 1 mg by mouth at bedtime as needed for anxiety.   butalbital-acetaminophen-caffeine 50-325-40 MG tablet Commonly known as: FIORICET Take 1 tablet by mouth every 4 (four) hours as needed for headache or migraine.   ergocalciferol 1.25 MG (50000 UT) capsule Commonly known as: VITAMIN D2 Take 50,000 Units by mouth 2 (two) times a week.   Vitamin D (Ergocalciferol) 1.25 MG (50000 UNIT) Caps capsule Commonly known as: DRISDOL 1 capsule   levothyroxine 125 MCG tablet Commonly known as: SYNTHROID Take 1 tablet (125 mcg total) by mouth daily before breakfast.   liothyronine 5 MCG tablet Commonly known as: CYTOMEL TAKE 1 TABLET DAILY   pyridoxine 100 MG tablet Commonly known as: B-6 Take 100 mg by mouth daily.   rosuvastatin 10 MG tablet Commonly known as: CRESTOR Take 10 mg by mouth daily.   vitamin B-12 500 MCG tablet Commonly known as: CYANOCOBALAMIN Take 500 mcg by mouth daily.           Review of Systems  Hirsutism: Her androgen levels were normal   She has history of syncope and may have dysautonomia, her tilt table test was possibly positive with relatively higher pulse rate but no drop in blood pressure at 70 degrees.  She has been only recommended high sodium low carbohydrate diet and possibly may go on beta-blockers            Examination:    BP (!) 150/84   Pulse (!) 119   Ht 5\' 7"  (1.702 m)   Wt 235 lb 9.6 oz (106.9 kg)   SpO2 99%   BMI 36.90 kg/m   She looks well, not unusually anxious  Thyroid not palpable No tremor Hands are not unusually warm  Repeat pulse rate is still  over 100  Biceps reflexes appear slightly brisk  Assessment:  Hashimoto's thyroiditis and hypothyroidism  She is taking levothyroxine 125 mcg with liothyronine 5 mcg Although she had felt reasonably well 6 months ago she is now complaining of insomnia and fatigue Although not complaining of anxiety she appears to be tachycardic and blood pressure is relatively higher today  Has had chronic hair loss of unclear etiology  PLAN:    Check free T4, free T3 and TSH today Dosage regimen will be adjusted as required  If no change needed she will  follow-up with her PCP to evaluate her insomnia   Reather Littler 07/04/2021, 3:29 PM    This visit occurred during the SARS-CoV-2 public health emergency.  Safety protocols were in place, including screening questions prior to the visit, additional usage of staff PPE, and extensive cleaning of exam room while observing appropriate contact time as indicated for disinfecting solutions.     Note: This office note was prepared with Insurance underwriter. Any transcriptional errors that result from this process are unintentional.   Addendum: Free T4 slightly higher than before and TSH suppressed, she will switch to 112 mcg levothyroxine and follow-up in 2 months  Cynthia Webb Lucianne Muss

## 2021-07-06 MED ORDER — LEVOTHYROXINE SODIUM 112 MCG PO TABS: 112.0000 ug | ORAL_TABLET | Freq: Every day | ORAL | 3 refills | Status: DC

## 2021-07-06 NOTE — Addendum Note (Signed)
Addended by: Eliseo Squires on: 07/06/2021 04:58 PM   Modules accepted: Orders

## 2021-07-14 ENCOUNTER — Telehealth: Payer: Self-pay | Admitting: Endocrinology

## 2021-07-14 DIAGNOSIS — E063 Autoimmune thyroiditis: Secondary | ICD-10-CM

## 2021-07-14 DIAGNOSIS — E038 Other specified hypothyroidism: Secondary | ICD-10-CM

## 2021-07-14 MED ORDER — LEVOTHYROXINE SODIUM 112 MCG PO TABS: 112.0000 ug | ORAL_TABLET | Freq: Every day | ORAL | 3 refills | Status: DC

## 2021-07-14 NOTE — Telephone Encounter (Signed)
Call regarding labs: Free T4 slightly higher than before and TSH suppressed indicating excessive levothyroxine, she will switch to 112 mcg levothyroxine and follow-up in 2 months.  She can finish off the 125 mcg levothyroxine supply but start skipping 1 tablet/week

## 2021-07-14 NOTE — Telephone Encounter (Signed)
Called patient gave results and sent in Rx for new dosage levothyroxine.

## 2021-07-19 ENCOUNTER — Other Ambulatory Visit: Payer: Self-pay | Admitting: Endocrinology

## 2021-09-23 ENCOUNTER — Ambulatory Visit: Payer: BC Managed Care – PPO | Admitting: Endocrinology

## 2021-09-23 ENCOUNTER — Encounter: Payer: Self-pay | Admitting: Endocrinology

## 2021-09-23 ENCOUNTER — Other Ambulatory Visit: Payer: Self-pay

## 2021-09-23 VITALS — BP 140/98 | HR 97 | Ht 67.0 in | Wt 227.4 lb

## 2021-09-23 DIAGNOSIS — R5383 Other fatigue: Secondary | ICD-10-CM | POA: Diagnosis not present

## 2021-09-23 DIAGNOSIS — Z13220 Encounter for screening for lipoid disorders: Secondary | ICD-10-CM | POA: Diagnosis not present

## 2021-09-23 DIAGNOSIS — E063 Autoimmune thyroiditis: Secondary | ICD-10-CM | POA: Diagnosis not present

## 2021-09-23 LAB — CBC
HCT: 40.2 % (ref 36.0–46.0)
Hemoglobin: 13.4 g/dL (ref 12.0–15.0)
MCHC: 33.2 g/dL (ref 30.0–36.0)
MCV: 90.6 fl (ref 78.0–100.0)
Platelets: 308 10*3/uL (ref 150.0–400.0)
RBC: 4.44 Mil/uL (ref 3.87–5.11)
RDW: 13.8 % (ref 11.5–15.5)
WBC: 6.8 10*3/uL (ref 4.0–10.5)

## 2021-09-23 LAB — LIPID PANEL
Cholesterol: 256 mg/dL — ABNORMAL HIGH (ref 0–200)
HDL: 38.2 mg/dL — ABNORMAL LOW (ref >39.00)
LDL Cholesterol: 179 mg/dL — ABNORMAL HIGH (ref 0–99)
NonHDL: 218.29
Total CHOL/HDL Ratio: 7
Triglycerides: 196 mg/dL — ABNORMAL HIGH (ref 0.0–149.0)
VLDL: 39.2 mg/dL (ref 0.0–40.0)

## 2021-09-23 LAB — T4, FREE: Free T4: 0.79 ng/dL (ref 0.60–1.60)

## 2021-09-23 LAB — T3, FREE: T3, Free: 3 pg/mL (ref 2.3–4.2)

## 2021-09-23 LAB — TSH: TSH: 0.61 u[IU]/mL (ref 0.35–5.50)

## 2021-09-23 NOTE — Patient Instructions (Signed)
Ok to use Minoxidil

## 2021-09-23 NOTE — Progress Notes (Signed)
Patient ID: Cynthia Webb, female   DOB: 04-01-1981, 40 y.o.   MRN: [id number]            Referring Provider: Dr. Donnel Saxon   Reason for Appointment:  Hypothyroidism, follow-up visit    History of Present Illness:   Hypothyroidism was first diagnosed in 2011   PRIOR history on initial consultation as follows:  At the time of diagnosis patient was having symptoms of   extreme fatigue, somnolence, feeling of brain fog and does not remember other symptoms.  She thinks she was having symptoms for 10 years She says she was told her symptoms resembled hypothyroidism but her tests would be normal No details of previous labs are available  The patient was started on Synthroid by her PCP because she was found to have antibody tests for Hashimoto's disease She did not significantly feel any better with taking Synthroid and a couple of years later was switched to Armour Thyroid.  She thinks that with Armour Thyroid her fatigue was better but not normal Prior to her initial consultation about 1 to 1-1/2 years before the dose was increased from 90 up to 120 mg daily.  She may have felt a little better with the dosage increase  RECENT HISTORY:  Since she was having significant fatigue with Armour Thyroid she has been changed to levothyroxine and liothyronine separately on her initial consultation in 3/21  Her baseline symptoms had been increasing fatigue and sleepiness.  She would take naps on the weekend Also reported difficulty with memory, absorbing details when she is studying and brain fog. Had complaints of having swelling and feels bloated.  She is now taking 112 mcg levothyroxine along with 5 mcg liothyronine in the mornings  She did not feel any better with reducing her levothyroxine on the previous visit and still has persistent fatigue Again has significant difficulty with sleep and keeps waking up periodically She has not discussed this with her PCP Does not complain of  excessive anxiety  She has persistent hair loss and she is concerned about a little thinning of the frontal hair  She has lost weight from taking Ozempic  She is regular with taking her supplements before breakfast  Her thyroid levels on the last visit showed excessive levothyroxine dose with relatively low TSH          Patient's weight history is as follows:  Wt Readings from Last 3 Encounters:  09/23/21 227 lb 6.4 oz (103.1 kg)  07/04/21 235 lb 9.6 oz (106.9 kg)  12/28/20 233 lb 6.4 oz (105.9 kg)    Thyroid function results have been as follows:  Lab Results  Component Value Date   TSH 0.13 (L) 07/04/2021   TSH 0.95 12/28/2020   TSH 0.36 07/28/2020   TSH 0.04 (L) 05/25/2020   FREET4 0.91 07/04/2021   FREET4 0.77 12/28/2020   FREET4 0.78 07/28/2020   FREET4 1.02 05/25/2020   T3FREE 3.3 07/04/2021   T3FREE 3.2 12/28/2020   T3FREE 3.0 07/28/2020    No thyroid ultrasound reports available, apparently she was previously found to have stable thyroid nodules as of about 2019   Past Medical History:  Diagnosis Date   Anxiety    Complication of anesthesia    panic when wakes up    Depression    Hyperlipidemia    Hypothyroidism     Past Surgical History:  Procedure Laterality Date   KNEE SURGERY     LAPAROSCOPIC GASTRIC BANDING WITH HIATAL HERNIA REPAIR  08/17/2015  Procedure: LAPAROSCOPIC GASTRIC BANDING WITH HIATAL HERNIA REPAIR;  Surgeon: Gaynelle Adu, MD;  Location: [facility];  Service: General;;    Family History  Problem Relation Age of Onset   Diabetes Mother    Hashimoto's thyroiditis Mother      pots  Social History:  reports that she has never smoked. She has never used smokeless tobacco. She reports current alcohol use. She reports that she does not use drugs.  Allergies: No Known Allergies  Allergies as of 09/23/2021   No Known Allergies      Medication List        Accurate as of September 23, 2021 10:10 AM. If you have any questions, ask  your nurse or doctor.          ALPRAZolam 1 MG tablet Commonly known as: XANAX Take 1 mg by mouth at bedtime as needed for anxiety.   butalbital-acetaminophen-caffeine 50-325-40 MG tablet Commonly known as: FIORICET Take 1 tablet by mouth every 4 (four) hours as needed for headache or migraine.   ergocalciferol 1.25 MG (50000 UT) capsule Commonly known as: VITAMIN D2 Take 50,000 Units by mouth 2 (two) times a week.   Vitamin D (Ergocalciferol) 1.25 MG (50000 UNIT) Caps capsule Commonly known as: DRISDOL 1 capsule   levothyroxine 112 MCG tablet Commonly known as: SYNTHROID Take 1 tablet (112 mcg total) by mouth daily.   levothyroxine 112 MCG tablet Commonly known as: SYNTHROID Take 1 tablet (112 mcg total) by mouth daily.   liothyronine 5 MCG tablet Commonly known as: CYTOMEL TAKE 1 TABLET DAILY   pyridoxine 100 MG tablet Commonly known as: B-6 Take 100 mg by mouth daily.   vitamin B-12 500 MCG tablet Commonly known as: CYANOCOBALAMIN Take 500 mcg by mouth daily.           Review of Systems  Hirsutism/hair loss: Her androgen levels were normal Menstrual cycles are regular  She has history of syncope and may have dysautonomia, her tilt table test was possibly positive with relatively higher pulse rate but no drop in blood pressure at 70 degrees.  She has been only recommended high sodium low carbohydrate diet and possibly may go on beta-blockers            Examination:    BP (!) 140/98 (BP Location: Left Arm, Patient Position: Sitting, Cuff Size: Normal)   Pulse 97   Ht 5\' 7"  (1.702 m)   Wt 227 lb 6.4 oz (103.1 kg)   SpO2 97%   BMI 35.62 kg/m    Assessment:  Hashimoto's thyroiditis and hypothyroidism  She is taking levothyroxine 112 mcg with liothyronine 5 mcg every morning  She is complaining of fatigue although her thyroid levels were not significantly abnormal on the last visit Most likely her fatigue is related to  her insomnia  Has had  chronic hair loss, 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  Safety protocols were in place, including screening questions prior to the visit, additional usage of staff PPE, and extensive cleaning of exam room while observing appropriate contact time as indicated for disinfecting solutions.     Note: This office note was prepared with 13/09/2021  voice recognition system technology. Any transcriptional errors that result from this process are unintentional.

## 2021-09-23 NOTE — Addendum Note (Signed)
Addended by: Adline Mango I on: 09/23/2021 10:30 AM   Modules accepted: Orders

## 2022-01-09 ENCOUNTER — Ambulatory Visit: Payer: BC Managed Care – PPO | Admitting: Endocrinology

## 2022-01-30 ENCOUNTER — Encounter: Payer: Self-pay | Admitting: Endocrinology

## 2022-01-30 ENCOUNTER — Ambulatory Visit: Payer: BC Managed Care – PPO | Admitting: Endocrinology

## 2022-01-30 ENCOUNTER — Other Ambulatory Visit: Payer: Self-pay

## 2022-01-30 VITALS — BP 122/84 | HR 79 | Ht 67.0 in | Wt 240.2 lb

## 2022-01-30 DIAGNOSIS — E063 Autoimmune thyroiditis: Secondary | ICD-10-CM | POA: Diagnosis not present

## 2022-01-30 LAB — T3, FREE: T3, Free: 3.8 pg/mL (ref 2.3–4.2)

## 2022-01-30 LAB — TSH: TSH: 2.39 u[IU]/mL (ref 0.35–5.50)

## 2022-01-30 LAB — T4, FREE: Free T4: 0.74 ng/dL (ref 0.60–1.60)

## 2022-01-30 NOTE — Progress Notes (Signed)
Patient ID: Cynthia Webb, female   DOB: 07-16-1981, 41 y.o.   MRN: [id number] ? ?       ? ? ? ?Referring Provider: Dr. Celedonio Miyamoto  ? ?Reason for Appointment:  Hypothyroidism, follow-up visit ? ? ? History of Present Illness:  ? ?Hypothyroidism was first diagnosed in 2011 ? ? ?PRIOR history on initial consultation as follows: ? ?At the time of diagnosis patient was having symptoms of   extreme fatigue, somnolence, feeling of brain fog and does not remember other symptoms.  She thinks she was having symptoms for 10 years ?She says she was told her symptoms resembled hypothyroidism but her tests would be normal ?No details of previous labs are available ? ?The patient was started on Synthroid by her PCP because she was found to have antibody tests for Hashimoto's disease ?She did not significantly feel any better with taking Synthroid and a couple of years later was switched to Armour Thyroid.  She thinks that with Armour Thyroid her fatigue was better but not normal ?Prior to her initial consultation about 1 to 1-1/2 years before the dose was increased from 90 up to 120 mg daily.  She may have felt a little better with the dosage increase ? ?RECENT HISTORY: ? ?Since she was having significant fatigue with Armour Thyroid she has been changed to levothyroxine and liothyronine separately on her initial consultation in 3/21 ? ?Her baseline symptoms had been increasing fatigue and sleepiness.  She would take naps on the weekend ?Also reported difficulty with memory, absorbing details when she is studying and brain fog. ?Had complaints of having swelling and feels bloated. ? ?She is now taking 112 mcg levothyroxine along with 5 mcg liothyronine in the mornings ? ?Again has significant difficulty with sleep and keeps waking up periodically ?On some days she has good energy level but the next day she feels tired ? ?She has lost weight from taking Ozempic ? ?She is regular with taking her supplements before breakfast ? ?Her  thyroid levels on the last visit were back to normal ?        ?Patient's weight history is as follows: ? ?Wt Readings from Last 3 Encounters:  ?01/30/22 240 lb 3.2 oz (109 kg)  ?09/23/21 227 lb 6.4 oz (103.1 kg)  ?07/04/21 235 lb 9.6 oz (106.9 kg)  ? ? ?Thyroid function results have been as follows: ? ?Lab Results  ?Component Value Date  ? TSH 0.61 09/23/2021  ? TSH 0.13 (L) 07/04/2021  ? TSH 0.95 12/28/2020  ? TSH 0.36 07/28/2020  ? FREET4 0.79 09/23/2021  ? FREET4 0.91 07/04/2021  ? FREET4 0.77 12/28/2020  ? FREET4 0.78 07/28/2020  ? T3FREE 3.0 09/23/2021  ? T3FREE 3.3 07/04/2021  ? T3FREE 3.2 12/28/2020  ? ? ?No thyroid ultrasound reports available, apparently she was previously found to have stable thyroid nodules as of about 2019 ? ?HAIR loss: ? ? ?She has persistent hair loss and she is concerned about a little thinning of the frontal hair ?She thinks this is a little better with taking collagen supplements OTC ? ?Past Medical History:  ?Diagnosis Date  ? Anxiety   ? Complication of anesthesia   ? panic when wakes up   ? Depression   ? Hyperlipidemia   ? Hypothyroidism   ? ? ?Past Surgical History:  ?Procedure Laterality Date  ? KNEE SURGERY    ? LAPAROSCOPIC GASTRIC BANDING WITH HIATAL HERNIA REPAIR  08/17/2015  ? Procedure: LAPAROSCOPIC GASTRIC BANDING WITH HIATAL HERNIA  REPAIR;  Surgeon: Greer Pickerel, MD;  Location: [facility];  Service: General;;  ? ? ?Family History  ?Problem Relation Age of Onset  ? Diabetes Mother   ? Hashimoto's thyroiditis Mother   ?  ? ?pots ? ?Social History:  reports that she has never smoked. She has never used smokeless tobacco. She reports current alcohol use. She reports that she does not use drugs. ? ?Allergies: No Known Allergies ? ?Allergies as of 01/30/2022   ?No Known Allergies ?  ? ?  ?Medication List  ?  ? ?  ? Accurate as of January 30, 2022  1:47 PM. If you have any questions, ask your nurse or doctor.  ?  ?  ? ?  ? ?ALPRAZolam 1 MG tablet ?Commonly known as: Duanne Moron ?Take 1 mg  by mouth at bedtime as needed for anxiety. ?  ?butalbital-acetaminophen-caffeine 50-325-40 MG tablet ?Commonly known as: FIORICET ?Take 1 tablet by mouth every 4 (four) hours as needed for headache or migraine. ?  ?ergocalciferol 1.25 MG (50000 UT) capsule ?Commonly known as: VITAMIN D2 ?Take 50,000 Units by mouth 2 (two) times a week. ?  ?Vitamin D (Ergocalciferol) 1.25 MG (50000 UNIT) Caps capsule ?Commonly known as: DRISDOL ?1 capsule ?  ?levothyroxine 112 MCG tablet ?Commonly known as: SYNTHROID ?Take 1 tablet (112 mcg total) by mouth daily. ?  ?levothyroxine 112 MCG tablet ?Commonly known as: SYNTHROID ?Take 1 tablet (112 mcg total) by mouth daily. ?  ?liothyronine 5 MCG tablet ?Commonly known as: CYTOMEL ?TAKE 1 TABLET DAILY ?  ?Livalo 4 MG Tabs ?Generic drug: Pitavastatin Calcium ?Take 1 tablet by mouth daily. ?  ?nadolol 20 MG tablet ?Commonly known as: CORGARD ?Take by mouth. ?  ?pyridoxine 100 MG tablet ?Commonly known as: B-6 ?Take 100 mg by mouth daily. ?  ?SUMAtriptan 100 MG tablet ?Commonly known as: IMITREX ?Take by mouth. ?  ?vitamin B-12 500 MCG tablet ?Commonly known as: CYANOCOBALAMIN ?Take 500 mcg by mouth daily. ?  ? ?  ? ? ?  ? ?Review of Systems ? ?Hirsutism/hair loss: Her androgen levels were normal ?Menstrual cycles are regular ? ?She has history of syncope and may have dysautonomia, her tilt table test was positive with relatively higher pulse rate but no drop in blood pressure at 70 degrees.  She has been only recommended high sodium low carbohydrate diet  ?Now on beta-blocker with Corgard because of tachycardia with some improvement ? ?         ? Examination: ?  ? BP 122/84   Pulse 79   Ht 5\' 7"  (1.702 m)   Wt 240 lb 3.2 oz (109 kg)   SpO2 99%   BMI 37.62 kg/m?  ? ?Thyroid not palpable ?Biceps/triceps reflexes show normal relaxation ?No evident hair loss ? ?Assessment: ? ?Hashimoto's thyroiditis and hypothyroidism ? ?She is taking levothyroxine 112 mcg with liothyronine 5 mcg every  morning ? ?She is complaining of fatigue intermittently which is not apparently related to her hypothyroidism ?Her weight gain is also nonspecific ? ? ? ?PLAN:  ? ? ?Check free T4, free T3 and TSH today ? ?Adjust dosage as needed and if no change needed she will come back in 72-month ?To follow-up with PCP regarding insomnia ? ? ?Elayne Snare ?01/30/2022, 1:47 PM  ? ? ?Note: This office note was prepared with Estate agent. Any transcriptional errors that result from this process are unintentional. ? ? ? ?

## 2022-06-23 ENCOUNTER — Other Ambulatory Visit: Payer: Self-pay | Admitting: Endocrinology

## 2022-06-23 DIAGNOSIS — E063 Autoimmune thyroiditis: Secondary | ICD-10-CM

## 2022-07-31 ENCOUNTER — Encounter: Payer: Self-pay | Admitting: Endocrinology

## 2022-07-31 ENCOUNTER — Ambulatory Visit: Payer: BC Managed Care – PPO | Admitting: Endocrinology

## 2022-07-31 VITALS — BP 138/84 | HR 72 | Ht 68.0 in | Wt 243.4 lb

## 2022-07-31 DIAGNOSIS — E538 Deficiency of other specified B group vitamins: Secondary | ICD-10-CM | POA: Diagnosis not present

## 2022-07-31 DIAGNOSIS — E063 Autoimmune thyroiditis: Secondary | ICD-10-CM | POA: Diagnosis not present

## 2022-07-31 MED ORDER — ESCITALOPRAM OXALATE 10 MG PO TABS: 10.0000 mg | ORAL_TABLET | Freq: Every day | ORAL | 2 refills | Status: AC

## 2022-07-31 NOTE — Progress Notes (Signed)
Patient ID: Cynthia Webb, female   DOB: [birth year], 41 y.o.   MRN: [id number]            Referring Provider: Dr. Celedonio Miyamoto   Reason for Appointment:  Hypothyroidism, follow-up visit    History of Present Illness:   Hypothyroidism was first diagnosed in 2011   PRIOR history on initial consultation as follows:  At the time of diagnosis patient was having symptoms of   extreme fatigue, somnolence, feeling of brain fog and does not remember other symptoms.  She thinks she was having symptoms for 10 years She says she was told her symptoms resembled hypothyroidism but her tests would be normal No details of previous labs are available  The patient was started on Synthroid by her PCP because she was found to have antibody tests for Hashimoto's disease She did not significantly feel any better with taking Synthroid and a couple of years later was switched to Armour Thyroid.  She thinks that with Armour Thyroid her fatigue was better but not normal Prior to her initial consultation about 1 to 1-1/2 years before the dose was increased from 90 up to 120 mg daily.  She may have felt a little better with the dosage increase  RECENT HISTORY:  Since she was having significant fatigue with Armour Thyroid she has been changed to levothyroxine and liothyronine separately on her initial consultation in 3/21  Her baseline symptoms had been increasing fatigue and sleepiness.  She would take naps on the weekend Also reported difficulty with memory, absorbing details when she is studying and brain fog. Had complaints of having swelling and feels bloated.  She is now taking 112 mcg levothyroxine along with 5 mcg liothyronine in the mornings  Current doses were continued unchanged in March when she last came in, she had only intermittent fatigue at that time For the last 6 to 7 weeks she says that she has been feeling extremely tired and sleeping excessively No cold intolerance and no weight  changes Her hair loss is somewhat less However she feels that she is significantly depressed and has not been able to discuss with her PCP  She is regular with taking her supplements before breakfast  Her thyroid levels are pending         Patient's weight history is as follows:  Wt Readings from Last 3 Encounters:  07/31/22 243 lb 6.4 oz (110.4 kg)  01/30/22 240 lb 3.2 oz (109 kg)  09/23/21 227 lb 6.4 oz (103.1 kg)    Thyroid function results have been as follows:  Lab Results  Component Value Date   TSH 2.39 01/30/2022   TSH 0.61 09/23/2021   TSH 0.13 (L) 07/04/2021   TSH 0.95 12/28/2020   FREET4 0.74 01/30/2022   FREET4 0.79 09/23/2021   FREET4 0.91 07/04/2021   FREET4 0.77 12/28/2020   T3FREE 3.8 01/30/2022   T3FREE 3.0 09/23/2021   T3FREE 3.3 07/04/2021    No thyroid ultrasound reports available, apparently she was previously found to have stable thyroid nodules as of about 2019  HAIR loss:  She has persistent hair loss and this may be a little better now Has been taking collagen supplements OTC  Past Medical History:  Diagnosis Date   Anxiety    Complication of anesthesia    panic when wakes up    Depression    Hyperlipidemia    Hypothyroidism     Past Surgical History:  Procedure Laterality Date   KNEE SURGERY  LAPAROSCOPIC GASTRIC BANDING WITH HIATAL HERNIA REPAIR  08/17/2015   Procedure: LAPAROSCOPIC GASTRIC BANDING WITH HIATAL HERNIA REPAIR;  Surgeon: Gaynelle Adu, MD;  Location: [facility];  Service: General;;    Family History  Problem Relation Age of Onset   Diabetes Mother    Hashimoto's thyroiditis Mother      pots  Social History:  reports that she has never smoked. She has never used smokeless tobacco. She reports current alcohol use. She reports that she does not use drugs.  Allergies: No Known Allergies  Allergies as of 07/31/2022   No Known Allergies      Medication List        Accurate as of July 31, 2022  1:39 PM. If  you have any questions, ask your nurse or doctor.          ALPRAZolam 1 MG tablet Commonly known as: XANAX Take 1 mg by mouth at bedtime as needed for anxiety.   butalbital-acetaminophen-caffeine 50-325-40 MG tablet Commonly known as: FIORICET Take 1 tablet by mouth every 4 (four) hours as needed for headache or migraine.   cyanocobalamin 500 MCG tablet Commonly known as: VITAMIN B12 Take 500 mcg by mouth daily.   ergocalciferol 1.25 MG (50000 UT) capsule Commonly known as: VITAMIN D2 Take 50,000 Units by mouth 2 (two) times a week.   Vitamin D (Ergocalciferol) 1.25 MG (50000 UNIT) Caps capsule Commonly known as: DRISDOL   levothyroxine 112 MCG tablet Commonly known as: SYNTHROID Take 1 tablet by mouth daily.   liothyronine 5 MCG tablet Commonly known as: CYTOMEL TAKE 1 TABLET DAILY   Livalo 4 MG Tabs Generic drug: Pitavastatin Calcium Take 1 tablet by mouth daily.   nadolol 20 MG tablet Commonly known as: CORGARD Take by mouth.   pyridoxine 100 MG tablet Commonly known as: B-6 Take 100 mg by mouth daily.   SUMAtriptan 100 MG tablet Commonly known as: IMITREX Take by mouth.           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  She has been only recommended high sodium low carbohydrate diet  Now on beta-blocker with Corgard because of tachycardia with some improvement            Examination:    BP (!) 138/4   Pulse 72   Ht 5\' 8"  (1.727 m)   Wt 243 lb 6.4 oz (110.4 kg)   SpO2 98%   BMI 37.01 kg/m   Thyroid not palpable   Assessment:  Hashimoto's thyroiditis and hypothyroidism  She is taking levothyroxine 112 mcg with liothyronine 5 mcg every morning  She is complaining of excessive somnolence but this appears to be  associated with depression However will need to check her thyroid levels also  BMI 37: No recent weight gain  History of B12 deficiency: Not clear if she has had any follow-up B12 levels on supplements and will check today  PLAN:    Check free T4, free T3 and TSH today  She is requesting Lexapro as she is not able to see her PCP and has used this before without side effect We will send prescription for 10 mg daily and she will follow-up with her PCP  If no changes made in her thyroid regimen she will be back in  6 months    Reather Littler 07/31/2022, 1:39 PM    Note: This office note was prepared with Dragon voice recognition system technology. Any transcriptional errors that result from this process are unintentional.

## 2022-08-01 LAB — TSH: TSH: 0.44 u[IU]/mL (ref 0.35–5.50)

## 2022-08-01 LAB — T3, FREE: T3, Free: 3.4 pg/mL (ref 2.3–4.2)

## 2022-08-01 LAB — T4, FREE: Free T4: 0.81 ng/dL (ref 0.60–1.60)

## 2022-08-01 LAB — VITAMIN B12: Vitamin B-12: 377 pg/mL (ref 211–911)

## 2022-08-01 NOTE — Progress Notes (Signed)
Please call to let patient know that the thyroid level is normal and to continue same regimen

## 2022-10-21 ENCOUNTER — Other Ambulatory Visit: Payer: Self-pay | Admitting: Endocrinology

## 2022-10-21 DIAGNOSIS — E063 Autoimmune thyroiditis: Secondary | ICD-10-CM

## 2023-01-22 ENCOUNTER — Ambulatory Visit: Payer: BC Managed Care – PPO | Admitting: Endocrinology

## 2023-01-22 ENCOUNTER — Encounter: Payer: Self-pay | Admitting: Endocrinology

## 2023-01-22 VITALS — BP 122/70 | HR 70 | Ht 68.0 in | Wt 239.0 lb

## 2023-01-22 DIAGNOSIS — E063 Autoimmune thyroiditis: Secondary | ICD-10-CM

## 2023-01-22 LAB — T4, FREE: Free T4: 0.92 ng/dL (ref 0.60–1.60)

## 2023-01-22 LAB — T3, FREE: T3, Free: 3.2 pg/mL (ref 2.3–4.2)

## 2023-01-22 LAB — TSH: TSH: 0.24 u[IU]/mL — ABNORMAL LOW (ref 0.35–5.50)

## 2023-01-22 NOTE — Progress Notes (Unsigned)
Patient ID: Cynthia Webb, female   DOB: 07-22-1981, 42 y.o.   MRN: [id number]            Referring Provider: Dr. Celedonio Miyamoto   Reason for Appointment:  Hypothyroidism, follow-up visit    History of Present Illness:   Hypothyroidism was first diagnosed in 2011   PRIOR history on initial consultation as follows:  At the time of diagnosis patient was having symptoms of   extreme fatigue, somnolence, feeling of brain fog and does not remember other symptoms.  She thinks she was having symptoms for 10 years She says she was told her symptoms resembled hypothyroidism but her tests would be normal No details of previous labs are available  The patient was started on Synthroid by her PCP because she was found to have antibody tests for Hashimoto's disease She did not significantly feel any better with taking Synthroid and a couple of years later was switched to Armour Thyroid.  She thinks that with Armour Thyroid her fatigue was better but not normal Prior to her initial consultation about 1 to 1-1/2 years before the dose was increased from 90 up to 120 mg daily.  She may have felt a little better with the dosage increase  RECENT HISTORY:  Since she was having significant fatigue with Armour Thyroid she has been changed to levothyroxine and liothyronine separately on her initial consultation in 3/21  Her baseline symptoms had been increasing fatigue and sleepiness.  She would take naps on the weekend Also reported difficulty with memory, absorbing details when she is studying and brain fog. Had complaints of having swelling and feels bloated.  She is now taking 112 mcg levothyroxine along with 5 mcg liothyronine in the mornings  Current doses were continued unchanged in March when she last came in, she had only intermittent fatigue at that time For the last 6 to 7 weeks she says that she has been feeling extremely tired and sleeping excessively No cold intolerance and no weight  changes Her hair loss is somewhat   She is regular with taking her supplements before breakfast  Her thyroid levels are pending         Patient's weight history is as follows:  Wt Readings from Last 3 Encounters:  01/22/23 239 lb (108.4 kg)  07/31/22 243 lb 6.4 oz (110.4 kg)  01/30/22 240 lb 3.2 oz (109 kg)    Thyroid function results have been as follows:  Lab Results  Component Value Date   TSH 0.44 07/31/2022   TSH 2.39 01/30/2022   TSH 0.61 09/23/2021   TSH 0.13 (L) 07/04/2021   FREET4 0.81 07/31/2022   FREET4 0.74 01/30/2022   FREET4 0.79 09/23/2021   FREET4 0.91 07/04/2021   T3FREE 3.4 07/31/2022   T3FREE 3.8 01/30/2022   T3FREE 3.0 09/23/2021    No thyroid ultrasound reports available, apparently she was previously found to have stable thyroid nodules as of about 2019  HAIR loss:  She has persistent hair loss and this may be a little better now Has been taking collagen supplements OTC  Past Medical History:  Diagnosis Date   Anxiety    Complication of anesthesia    panic when wakes up    Depression    Hyperlipidemia    Hypothyroidism     Past Surgical History:  Procedure Laterality Date   KNEE SURGERY     LAPAROSCOPIC GASTRIC BANDING WITH HIATAL HERNIA REPAIR  08/17/2015   Procedure: LAPAROSCOPIC GASTRIC BANDING WITH HIATAL HERNIA REPAIR;  Surgeon: Greer Pickerel, MD;  Location: [facility];  Service: General;;    Family History  Problem Relation Age of Onset   Diabetes Mother    Hashimoto's thyroiditis Mother      pots  Social History:  reports that she has never smoked. She has never used smokeless tobacco. She reports current alcohol use. She reports that she does not use drugs.  Allergies:  Allergies  Allergen Reactions   Semaglutide Nausea And Vomiting and Nausea Only    Other reaction(s): nausea and vomiting    Allergies as of 01/22/2023       Reactions   Semaglutide Nausea And Vomiting, Nausea Only   Other reaction(s): nausea and  vomiting        Medication List        Accurate as of January 22, 2023  1:09 PM. If you have any questions, ask your nurse or doctor.          ALPRAZolam 1 MG tablet Commonly known as: XANAX Take 1 mg by mouth at bedtime as needed for anxiety.   butalbital-acetaminophen-caffeine 50-325-40 MG tablet Commonly known as: FIORICET Take 1 tablet by mouth every 4 (four) hours as needed for headache or migraine.   cyanocobalamin 500 MCG tablet Commonly known as: VITAMIN B12 Take 500 mcg by mouth daily.   ergocalciferol 1.25 MG (50000 UT) capsule Commonly known as: VITAMIN D2 Take 50,000 Units by mouth 2 (two) times a week.   Vitamin D (Ergocalciferol) 1.25 MG (50000 UNIT) Caps capsule Commonly known as: DRISDOL   escitalopram 10 MG tablet Commonly known as: Lexapro Take 1 tablet (10 mg total) by mouth daily.   levothyroxine 112 MCG tablet Commonly known as: SYNTHROID Take 1 tablet by mouth daily.   liothyronine 5 MCG tablet Commonly known as: CYTOMEL Take 1 tablet by mouth once daily on an empty stomach.   Livalo 4 MG Tabs Generic drug: Pitavastatin Calcium Take 1 tablet by mouth daily.   nadolol 20 MG tablet Commonly known as: CORGARD Take by mouth.   pyridoxine 100 MG tablet Commonly known as: B-6 Take 100 mg by mouth daily.   SUMAtriptan 100 MG tablet Commonly known as: IMITREX Take by mouth.   Zepbound 2.5 MG/0.5ML Pen Generic drug: tirzepatide Inject 2.5 mg into the skin once a week.           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  She has been only recommended high sodium low carbohydrate diet  Now on beta-blocker with Corgard because of tachycardia with some improvement            Examination:    BP 122/70 (BP  Location: Left Arm, Patient Position: Sitting, Cuff Size: Large)   Pulse 70   Ht '5\' 8"'$  (1.727 m)   Wt 239 lb (108.4 kg)   SpO2 96%   BMI 36.34 kg/m   Thyroid not palpable   Assessment:  Hashimoto's thyroiditis and hypothyroidism  She is taking levothyroxine 112 mcg with liothyronine 5 mcg every morning  She is complaining of excessive somnolence but this appears to be associated with depression However will need to check her thyroid levels also  BMI 37: No recent weight gain  History of B12 deficiency: Not clear if she has had any follow-up B12  levels on supplements and will check today  PLAN:    Check free T4, free T3 and TSH today  She is requesting Lexapro as she is not able to see her PCP and has used this before without side effect We will send prescription for 10 mg daily and she will follow-up with her PCP  If no changes made in her thyroid regimen she will be back in 6 months    Elayne Snare 01/22/2023, 1:09 PM    Note: This office note was prepared with Dragon voice recognition system technology. Any transcriptional errors that result from this process are unintentional.

## 2023-06-30 ENCOUNTER — Other Ambulatory Visit: Payer: Self-pay | Admitting: Endocrinology

## 2023-07-30 ENCOUNTER — Ambulatory Visit: Payer: BC Managed Care – PPO | Admitting: Endocrinology

## 2023-08-16 ENCOUNTER — Ambulatory Visit: Payer: BC Managed Care – PPO | Admitting: Endocrinology

## 2023-08-16 ENCOUNTER — Encounter: Payer: Self-pay | Admitting: Endocrinology

## 2023-08-16 VITALS — BP 115/90 | HR 73 | Ht 68.0 in | Wt 226.4 lb

## 2023-08-16 DIAGNOSIS — R252 Cramp and spasm: Secondary | ICD-10-CM | POA: Diagnosis not present

## 2023-08-16 DIAGNOSIS — D509 Iron deficiency anemia, unspecified: Secondary | ICD-10-CM

## 2023-08-16 DIAGNOSIS — L678 Other hair color and hair shaft abnormalities: Secondary | ICD-10-CM

## 2023-08-16 DIAGNOSIS — E66811 Obesity, class 1: Secondary | ICD-10-CM

## 2023-08-16 DIAGNOSIS — Z131 Encounter for screening for diabetes mellitus: Secondary | ICD-10-CM | POA: Diagnosis not present

## 2023-08-16 DIAGNOSIS — E063 Autoimmune thyroiditis: Secondary | ICD-10-CM

## 2023-08-16 NOTE — Addendum Note (Signed)
Addended by: Caitlyn Buchanan, [place] on: 08/16/2023 03:46 PM   Modules accepted: Level of Service

## 2023-08-16 NOTE — Progress Notes (Signed)
Outpatient Endocrinology Note Cynthia Kaide Gage, MD  08/16/23  Patient's Name: Cynthia Webb    DOB: 1981-02-01    MRN: [id number]  REASON OF VISIT: Follow-up for hypothyroidism  PCP: Galvin Proffer, MD  HISTORY OF PRESENT ILLNESS:   Cynthia Webb is a 42 y.o. old female with past medical history as listed below is presented for a follow up of hypothyroidism due to Hashimoto's thyroiditis.   Pertinent Thyroid History: - Patient was diagnosed with hypothyroidism  in 2011.  At the time of diagnosis she was having symptoms of extreme fatigue, somnolence, feeling of brain fog.  Per note she mentioned that her symptoms resembled with hypothyroidism but test were normal no details regarding available to review.  Synthroid was started by primary care provider because she was found to have antibody test for Hashimoto's disease.  She did not feel better on Synthroid and switched to Armour Thyroid.  She somewhat felt better on Armour Thyroid.  She was having significant fatigue on Armour Thyroid and changed to combination therapy with levothyroxine and liothyronine on initial consultation with endocrinology in March 2021.  She is currently taking levothyroxine 112 mcg daily and liothyronine 5 mcg daily.    Interval history 08/16/23 Patient has been taking levothyroxine 112 mcg daily and liothyronine 5 mcg daily.  She has noticed more hair loss and sometimes facial hair requiring waxing.  She had noticed significant constipation when she was in [place] in June.  She is having more muscle cramps especially she noticed when she was traveling on the airplane.  She reports compliance with thyroid medication and taking in the morning before breakfast.  No palpitation or heat intolerance or cold intolerance.  She sometimes feel like getting hot flashes.  Weight is about the same.  She has dry skin.  She has noticed some flushing in the face as well.  No striae in the abdomen.  REVIEW OF SYSTEMS:  As per  history of present illness.   PAST MEDICAL HISTORY: Past Medical History:  Diagnosis Date   Anxiety    Complication of anesthesia    panic when wakes up    Depression    Hyperlipidemia    Hypothyroidism     PAST SURGICAL HISTORY: Past Surgical History:  Procedure Laterality Date   KNEE SURGERY     LAPAROSCOPIC GASTRIC BANDING WITH HIATAL HERNIA REPAIR  08/17/2015   Procedure: LAPAROSCOPIC GASTRIC BANDING WITH HIATAL HERNIA REPAIR;  Surgeon: Gaynelle Adu, MD;  Location: [facility];  Service: General;;    ALLERGIES: Allergies  Allergen Reactions   Semaglutide Nausea And Vomiting and Nausea Only    Other reaction(s): nausea and vomiting    FAMILY HISTORY:  Family History  Problem Relation Age of Onset   Diabetes Mother    Hashimoto's thyroiditis Mother     SOCIAL HISTORY: Social History   Socioeconomic History   Marital status: Single    Spouse name: Not on file   Number of children: Not on file   Years of education: Not on file   Highest education level: Not on file  Occupational History   Not on file  Tobacco Use   Smoking status: Never   Smokeless tobacco: Never  Substance and Sexual Activity   Alcohol use: Yes    Comment: rare   Drug use: No   Sexual activity: Not on file  Other Topics Concern   Not on file  Social History Narrative   Not on file   Social Determinants of Health  Financial Resource Strain: Not on file  Food Insecurity: Not on file  Transportation Needs: Not on file  Physical Activity: Not on file  Stress: Not on file  Social Connections: Not on file    MEDICATIONS:  Current Outpatient Medications  Medication Sig Dispense Refill   ALPRAZolam (XANAX) 1 MG tablet Take 1 mg by mouth at bedtime as needed for anxiety.     butalbital-acetaminophen-caffeine (FIORICET, ESGIC) 50-325-40 MG per tablet Take 1 tablet by mouth every 4 (four) hours as needed for headache or migraine.     ergocalciferol (VITAMIN D2) 50000 UNITS capsule Take 50,000  Units by mouth 2 (two) times a week.     escitalopram (LEXAPRO) 10 MG tablet Take 1 tablet (10 mg total) by mouth daily. 30 tablet 2   levothyroxine (SYNTHROID) 112 MCG tablet Take 1 tablet by mouth daily. 90 tablet 2   liothyronine (CYTOMEL) 5 MCG tablet Take 1 tablet by mouth once daily on an empty stomach. 90 tablet 1   LIVALO 4 MG TABS Take 1 tablet by mouth daily.     nadolol (CORGARD) 20 MG tablet Take by mouth.     pyridoxine (B-6) 100 MG tablet Take 100 mg by mouth daily.     SUMAtriptan (IMITREX) 100 MG tablet Take by mouth.     vitamin B-12 (CYANOCOBALAMIN) 500 MCG tablet Take 500 mcg by mouth daily.     Vitamin D, Ergocalciferol, (DRISDOL) 1.25 MG (50000 UNIT) CAPS capsule      tirzepatide (ZEPBOUND) 2.5 MG/0.5ML Pen Inject 2.5 mg into the skin once a week. (Patient not taking: Reported on 08/16/2023)     No current facility-administered medications for this visit.    PHYSICAL EXAM: Vitals:   08/16/23 1504  BP: (!) 115/90  Pulse: 73  SpO2: 99%  Weight: 226 lb 6.4 oz (102.7 kg)  Height: 5\' 8"  (1.727 m)   Body mass index is 34.42 kg/m.  Wt Readings from Last 3 Encounters:  08/16/23 226 lb 6.4 oz (102.7 kg)  01/22/23 239 lb (108.4 kg)  07/31/22 243 lb 6.4 oz (110.4 kg)     General: Well developed, well nourished female in no apparent distress.  HEENT: AT/[place], no external lesions. Hearing intact to the spoken word Eyes: Conjunctiva clear and no icterus. Neck: Trachea midline, neck supple without appreciable thyromegaly or lymphadenopathy and no palpable thyroid nodules Lungs: Clear to auscultation, no wheeze. Respirations not labored Heart: S1S2, Regular in rate and rhythm. Abdomen: Soft, non tender, non distended Neurologic: Alert, oriented, normal speech, deep tendon biceps reflexes normal Extremities: No pedal pitting edema, no tremors of outstretched hands Skin: Warm, color good.  Psychiatric: Does not appear depressed or anxious  PERTINENT HISTORIC LABORATORY AND  IMAGING STUDIES:  All pertinent laboratory results were reviewed. Please see HPI also for further details.   TSH  Date Value Ref Range Status  01/22/2023 0.24 (L) 0.35 - 5.50 uIU/mL Final  07/31/2022 0.44 0.35 - 5.50 uIU/mL Final  01/30/2022 2.39 0.35 - 5.50 uIU/mL Final     ASSESSMENT / PLAN  1. Hypothyroidism due to Hashimoto's thyroiditis   2. Obesity (BMI 30.0-34.9)   3. Screening for diabetes mellitus   4. Abnormal facial hair   5. Iron deficiency anemia, unspecified iron deficiency anemia type   6. Muscle cramp     -Patient has hypothyroidism due to Hashimoto's thyroiditis. -She has some complaints of hair loss, constipation and muscle cramps. -She is currently taking levothyroxine 112 mcg daily and liothyronine 5 mcg daily.  Plan: -Check thyroid function test TSH, free T4, free T3.  And adjust dose as needed.  # Patient has obesity with BMI 34, she complains of facial hair. -She had normal testosterone levels in the past.  She is not aware about having PCOS. -No obvious features of hypercortisolism/Cushing's syndrome.   -I would like to check for cortisol level.  Discussed that cortisol level is best to check in the morning fasting.  Patient may not have time and need to take off from her work to do morning lab.  Will do cortisol level with other labs today.  If screening cortisol level is concerning we will plan for 1 mg dexamethasone overnight suppression test. -I would like to check hemoglobin A1c for a screening of type 2 diabetes mellitus.  # Patient complains about muscle cramps and wondering about the potassium is low : Will check BMP.  # Patient reports he has history of iron deficiency anemia, managed by primary care provider however would like to do the iron studies with her other labs today.  If abnormal will send the record to her primary care provider.  She is currently taking oral iron supplement in the afternoon daily. -Check iron studies   Diagnoses and  all orders for this visit:  Hypothyroidism due to Hashimoto's thyroiditis -     T3, free; Future -     T4, free; Future -     TSH; Future -     TSH -     T4, free -     T3, free  Obesity (BMI 30.0-34.9) -     Cortisol; Future -     Cortisol  Screening for diabetes mellitus -     Hemoglobin A1c; Future -     Hemoglobin A1c  Abnormal facial hair  Iron deficiency anemia, unspecified iron deficiency anemia type -     Iron, TIBC and Ferritin Panel; Future -     Iron, TIBC and Ferritin Panel  Muscle cramp -     Basic metabolic panel; Future -     Basic metabolic panel    DISPOSITION Follow up in clinic in 6 months suggested.  All questions answered and patient verbalized understanding of the plan.  Cynthia Jamaia Brum, MD [facility] Endocrinology [facility] [address] [place], [address] [place], [place] [address] Phone # [phone]  At least part of this note was generated using voice recognition software. Inadvertent word errors may have occurred, which were not recognized during the proofreading process.

## 2023-08-17 ENCOUNTER — Other Ambulatory Visit: Payer: Self-pay

## 2023-08-17 DIAGNOSIS — E063 Autoimmune thyroiditis: Secondary | ICD-10-CM

## 2023-08-17 LAB — TSH: TSH: 0.33 u[IU]/mL — ABNORMAL LOW (ref 0.35–5.50)

## 2023-08-17 LAB — IRON,TIBC AND FERRITIN PANEL
%SAT: 11 % — ABNORMAL LOW (ref 16–45)
Ferritin: 7 ng/mL — ABNORMAL LOW (ref 16–232)
Iron: 41 ug/dL (ref 40–190)
TIBC: 379 ug/dL (ref 250–450)

## 2023-08-17 LAB — BASIC METABOLIC PANEL
BUN: 9 mg/dL (ref 6–23)
CO2: 27 meq/L (ref 19–32)
Calcium: 9 mg/dL (ref 8.4–10.5)
Chloride: 103 meq/L (ref 96–112)
Creatinine, Ser: 0.73 mg/dL (ref 0.40–1.20)
GFR: 101.66 mL/min (ref >60.00)
Glucose, Bld: 83 mg/dL (ref 70–99)
Potassium: 4.1 meq/L (ref 3.5–5.1)
Sodium: 137 meq/L (ref 135–145)

## 2023-08-17 LAB — T3, FREE: T3, Free: 3.2 pg/mL (ref 2.3–4.2)

## 2023-08-17 LAB — HEMOGLOBIN A1C: Hgb A1c MFr Bld: 5.4 % (ref 4.6–6.5)

## 2023-08-17 LAB — CORTISOL: Cortisol, Plasma: 5.1 ug/dL

## 2023-08-17 LAB — T4, FREE: Free T4: 0.91 ng/dL (ref 0.60–1.60)

## 2023-08-17 MED ORDER — LEVOTHYROXINE SODIUM 112 MCG PO TABS: 112.0000 ug | ORAL_TABLET | Freq: Every day | ORAL | 2 refills | Status: DC

## 2024-02-14 ENCOUNTER — Ambulatory Visit: Payer: BC Managed Care – PPO | Admitting: Endocrinology

## 2024-02-14 ENCOUNTER — Encounter: Payer: Self-pay | Admitting: Endocrinology

## 2024-02-14 VITALS — BP 124/80 | HR 73 | Resp 20 | Ht 68.0 in | Wt 234.8 lb

## 2024-02-14 DIAGNOSIS — E063 Autoimmune thyroiditis: Secondary | ICD-10-CM | POA: Diagnosis not present

## 2024-02-14 NOTE — Progress Notes (Signed)
 Outpatient Endocrinology Note [name], MD  02/14/24  Patient's Name: Cynthia Webb    DOB: [birth year]    MRN: [id number]  REASON OF VISIT: Follow-up for hypothyroidism  PCP: Galvin Proffer, MD  HISTORY OF PRESENT ILLNESS:   Cynthia Webb is a 43 y.o. old female with past medical history as listed below is presented for a follow up of hypothyroidism due to Hashimoto's thyroiditis.   Pertinent Thyroid History: - Patient was diagnosed with hypothyroidism  in 2011.  At the time of diagnosis she was having symptoms of extreme fatigue, somnolence, feeling of brain fog.  Per note she mentioned that her symptoms resembled with hypothyroidism but test were normal no details regarding available to review.  Synthroid was started by primary care provider because she was found to have antibody test for Hashimoto's disease.  She did not feel better on Synthroid and switched to Armour Thyroid.  She somewhat felt better on Armour Thyroid.  She was having significant fatigue on Armour Thyroid and changed to combination therapy with levothyroxine and liothyronine on initial consultation with endocrinology in March 2021.  She is currently taking levothyroxine 112 mcg daily and liothyronine 5 mcg daily.    Interval history  Patient has been taking levothyroxine 112 mcg daily and liothyronine 5 mcg daily.  She takes this thyroid medication in the morning, reports compliance.  She has complaints of extreme tiredness about 1 to 2 weeks ago for about a month however she also got sick at that time with sinus infection.  He has iron deficiency anemia, taking iron supplement over-the-counter tablet, managed by primary care provider and has follow-up coming Monday.  No palpitation.  Sometimes she may feel little increase within tolerance.  No other complaints today.  In last 1 to 2 weeks she has been feeling better with normal energy.  She is wondering about intermittent fatigue.  Discussed that it can be  multifactorial.  She can also have fatigue from iron deficiency anemia.  REVIEW OF SYSTEMS:  As per history of present illness.   PAST MEDICAL HISTORY: Past Medical History:  Diagnosis Date   Anxiety    Complication of anesthesia    panic when wakes up    Depression    Hyperlipidemia    Hypothyroidism     PAST SURGICAL HISTORY: Past Surgical History:  Procedure Laterality Date   KNEE SURGERY     LAPAROSCOPIC GASTRIC BANDING WITH HIATAL HERNIA REPAIR  08/17/2015   Procedure: LAPAROSCOPIC GASTRIC BANDING WITH HIATAL HERNIA REPAIR;  Surgeon: Gaynelle Adu, MD;  Location: [facility];  Service: General;;    ALLERGIES: Allergies  Allergen Reactions   Semaglutide Nausea And Vomiting and Nausea Only    Other reaction(s): nausea and vomiting    FAMILY HISTORY:  Family History  Problem Relation Age of Onset   Diabetes Mother    Hashimoto's thyroiditis Mother     SOCIAL HISTORY: Social History   Socioeconomic History   Marital status: Single    Spouse name: Not on file   Number of children: Not on file   Years of education: Not on file   Highest education level: Not on file  Occupational History   Not on file  Tobacco Use   Smoking status: Never   Smokeless tobacco: Never  Substance and Sexual Activity   Alcohol use: Yes    Comment: rare   Drug use: No   Sexual activity: Not on file  Other Topics Concern   Not on file  Social  History Narrative   Not on file   Social Drivers of Health   Financial Resource Strain: Not on file  Food Insecurity: Not on file  Transportation Needs: Not on file  Physical Activity: Not on file  Stress: Not on file  Social Connections: Not on file    MEDICATIONS:  Current Outpatient Medications  Medication Sig Dispense Refill   ALPRAZolam (XANAX) 1 MG tablet Take 1 mg by mouth at bedtime as needed for anxiety.     butalbital-acetaminophen-caffeine (FIORICET, ESGIC) 50-325-40 MG per tablet Take 1 tablet by mouth every 4 (four) hours as  needed for headache or migraine.     ergocalciferol (VITAMIN D2) 50000 UNITS capsule Take 50,000 Units by mouth 2 (two) times a week.     escitalopram (LEXAPRO) 10 MG tablet Take 1 tablet (10 mg total) by mouth daily. 30 tablet 2   levothyroxine (SYNTHROID) 112 MCG tablet Take 1 tablet (112 mcg total) by mouth daily. 90 tablet 2   liothyronine (CYTOMEL) 5 MCG tablet Take 1 tablet by mouth once daily on an empty stomach. 90 tablet 1   LIVALO 4 MG TABS Take 1 tablet by mouth daily.     nadolol (CORGARD) 20 MG tablet Take by mouth.     pyridoxine (B-6) 100 MG tablet Take 100 mg by mouth daily.     SUMAtriptan (IMITREX) 100 MG tablet Take by mouth.     vitamin B-12 (CYANOCOBALAMIN) 500 MCG tablet Take 500 mcg by mouth daily.     Vitamin D, Ergocalciferol, (DRISDOL) 1.25 MG (50000 UNIT) CAPS capsule      No current facility-administered medications for this visit.    PHYSICAL EXAM: Vitals:   02/14/24 1610  BP: 124/80  Pulse: 73  Resp: 20  SpO2: 98%  Weight: 234 lb 12.8 oz (106.5 kg)  Height: 5\' 8"  (1.727 m)    Body mass index is 35.7 kg/m.  Wt Readings from Last 3 Encounters:  02/14/24 234 lb 12.8 oz (106.5 kg)  08/16/23 226 lb 6.4 oz (102.7 kg)  01/22/23 239 lb (108.4 kg)     General: Well developed, well nourished female in no apparent distress.  HEENT: AT/[place], no external lesions. Hearing intact to the spoken word Eyes: Conjunctiva clear and no icterus. Neck: Trachea midline, neck supple without appreciable thyromegaly or lymphadenopathy and no palpable thyroid nodules Lungs: Clear to auscultation, no wheeze. Respirations not labored Heart: S1S2, Regular in rate and rhythm. Abdomen: Soft, non tender, non distended Neurologic: Alert, oriented, normal speech, deep tendon biceps reflexes normal Extremities: No pedal pitting edema, no tremors of outstretched hands Skin: Warm, color good.  Psychiatric: Does not appear depressed or anxious  PERTINENT HISTORIC LABORATORY AND  IMAGING STUDIES:  All pertinent laboratory results were reviewed. Please see HPI also for further details.   TSH  Date Value Ref Range Status  08/16/2023 0.33 (L) 0.35 - 5.50 uIU/mL Final  01/22/2023 0.24 (L) 0.35 - 5.50 uIU/mL Final  07/31/2022 0.44 0.35 - 5.50 uIU/mL Final     ASSESSMENT / PLAN  1. Hypothyroidism due to Hashimoto's thyroiditis    -Patient has hypothyroidism due to Hashimoto's thyroiditis. -She is currently taking levothyroxine 112 mcg daily and liothyronine 5 mcg daily.  Plan: -Check thyroid function test TSH, free T4, free T3.  And adjust dose as needed. -Endocrinology follow-up every 6 months for now.  Diagnoses and all orders for this visit:  Hypothyroidism due to Hashimoto's thyroiditis -     T4, free -  T3, free -     TSH     DISPOSITION Follow up in clinic in 6 months suggested.  Labs on the same day of the visit.  All questions answered and patient verbalized understanding of the plan.  [place] Mead Slane, MD [facility] Endocrinology [facility] [address] [place], [address] [place], [place] [address] Phone # [phone]  At least part of this note was generated using voice recognition software. Inadvertent word errors may have occurred, which were not recognized during the proofreading process.

## 2024-02-15 ENCOUNTER — Encounter: Payer: Self-pay | Admitting: Endocrinology

## 2024-02-15 LAB — TSH: TSH: 1.2 m[IU]/L

## 2024-02-15 LAB — T3, FREE: T3, Free: 2.8 pg/mL (ref 2.3–4.2)

## 2024-02-15 LAB — T4, FREE: Free T4: 1 ng/dL (ref 0.8–1.8)

## 2024-06-12 ENCOUNTER — Encounter: Payer: Self-pay | Admitting: Internal Medicine

## 2024-06-12 ENCOUNTER — Ambulatory Visit: Payer: Self-pay | Admitting: Internal Medicine

## 2024-06-12 VITALS — BP 108/72 | HR 76 | Temp 99.1°F | Resp 20 | Ht 67.0 in | Wt 233.0 lb

## 2024-06-12 DIAGNOSIS — K219 Gastro-esophageal reflux disease without esophagitis: Secondary | ICD-10-CM

## 2024-06-12 DIAGNOSIS — J310 Chronic rhinitis: Secondary | ICD-10-CM | POA: Diagnosis not present

## 2024-06-12 DIAGNOSIS — E063 Autoimmune thyroiditis: Secondary | ICD-10-CM

## 2024-06-12 DIAGNOSIS — F558 Abuse of other non-psychoactive substances: Secondary | ICD-10-CM | POA: Diagnosis not present

## 2024-06-12 MED ORDER — RYALTRIS 665-25 MCG/ACT NA SUSP
2.0000 | Freq: Two times a day (BID) | NASAL | 5 refills | Status: DC | PRN
Start: 2024-06-12 — End: 2024-07-08

## 2024-06-12 NOTE — Patient Instructions (Signed)
 Chronic Rhinitis:  Previous allergy testing reportedly positive to molds Plan to update testing at follow-up. - Prevention:  - allergen avoidance when possible - consider allergy shots as long term control of your symptoms by teaching your immune system to be more tolerant of your allergy triggers - Symptom control: - Start Ryaltris  1-2 sprays in each nostril twice a day as needed for nasal congestion/itchy nose-this will go to a send out pharmacy in [place] called [facility] - Start Antihistamine: daily or daily as needed.   -Options include Zyrtec (Cetirizine) 10mg , Claritin (Loratadine) 10mg , Allegra (Fexofenadine) 180mg , or Xyzal (Levocetirinze) 5mg  - Can be purchased over-the-counter if not covered by insurance. - stop all decongestants as this can lead to rebound congestion and unwanted side effects like hypertension  GERD Continue omeprazole as prescribed. Let us  know if reflux becomes uncontrolled as this can contribute to rhinitis symptoms.  Follow up :August 26th at 9:30 AM (must stop antihistamines 3 days prior to visit) It was a pleasure meeting you in clinic today! Thank you for allowing me to participate in your care.  Rocky Endow, MD [facility]

## 2024-06-12 NOTE — Progress Notes (Signed)
 NEW PATIENT Date of Service/Encounter:   06/12/2024 Referring provider: Pia Kerney SQUIBB, MD Primary care provider: Pia Kerney SQUIBB, MD  Subjective:  Cynthia Webb is a 43 y.o. female with a PMHx of hypothyroidism due to Hashimoto, acid reflux presenting today for evaluation of chronic rhinitis. History obtained from: chart review and patient.   Chronic rhinitis: Seen in our office prior to 2016 and was diagnosed with allergies to mold. Originally symptoms were seasonal, but now having year-round symptoms causing headache and congestion if not taking Allegra daily. She has tried Flonase in the past but not using regularly. She does use Allegra-D and has been on this for several years. She does not feel she would have any trouble coming off of this, as she does so for weeks at a time when traveling out of the country or out of state where her symptoms are not present. Upon returning to this area, symptoms return.  She has a history of acid reflux, not followed by gastroenterology recently.  She takes omeprazole once a day, and denies any symptoms of heartburn or reflux.  She has a history of hypothyroidism and is on 2 medications for thyroid  disease.  She does follow-up regularly for this, and reports that this is controlled.  She reports intolerances to opioids and occluding itching.  She has also had intolerances with weight loss drugs including GI upset. She denies a history of asthma, eczema, food allergies. She denies any prior history of sinus surgeries, adenoid or tonsillectomy or ear tubes.  Chart Review:  Reviewed PCP notes from referral 03/17/24: allergic rhinitis   Past Medical History: Past Medical History:  Diagnosis Date   Anxiety    Complication of anesthesia    panic when wakes up    Depression    Hyperlipidemia    Hypothyroidism    Medication List:  Current Outpatient Medications  Medication Sig Dispense Refill   ALPRAZolam (XANAX) 1 MG tablet Take 1 mg  by mouth at bedtime as needed for anxiety.     butalbital-acetaminophen -caffeine (FIORICET, ESGIC) 50-325-40 MG per tablet Take 1 tablet by mouth every 4 (four) hours as needed for headache or migraine.     ergocalciferol (VITAMIN D2) 50000 UNITS capsule Take 50,000 Units by mouth 2 (two) times a week.     escitalopram  (LEXAPRO ) 10 MG tablet Take 1 tablet (10 mg total) by mouth daily. 30 tablet 2   levothyroxine  (SYNTHROID ) 112 MCG tablet Take 1 tablet (112 mcg total) by mouth daily. 90 tablet 2   liothyronine  (CYTOMEL ) 5 MCG tablet Take 1 tablet by mouth once daily on an empty stomach. 90 tablet 1   nadolol (CORGARD) 20 MG tablet Take by mouth.     Olopatadine-Mometasone (RYALTRIS ) 665-25 MCG/ACT SUSP Place 2 sprays into the nose 2 (two) times daily as needed. 29 g 5   pyridoxine (B-6) 100 MG tablet Take 100 mg by mouth daily.     SUMAtriptan (IMITREX) 100 MG tablet Take by mouth.     vitamin B-12 (CYANOCOBALAMIN) 500 MCG tablet Take 500 mcg by mouth daily.     LIVALO 4 MG TABS Take 1 tablet by mouth daily.     Vitamin D, Ergocalciferol, (DRISDOL) 1.25 MG (50000 UNIT) CAPS capsule      No current facility-administered medications for this visit.   Known Allergies:  Allergies  Allergen Reactions   Hydrocodone-Acetaminophen  Other (See Comments)    Other reaction(s): Unknown   Semaglutide Nausea And Vomiting and Nausea Only  Other reaction(s): nausea and vomiting   Past Surgical History: Past Surgical History:  Procedure Laterality Date   KNEE SURGERY     LAPAROSCOPIC GASTRIC BANDING WITH HIATAL HERNIA REPAIR  08/17/2015   Procedure: LAPAROSCOPIC GASTRIC BANDING WITH HIATAL HERNIA REPAIR;  Surgeon: Camellia Blush, MD;  Location: [facility];  Service: General;;   Family History: Family History  Problem Relation Age of Onset   Diabetes Mother    Hashimoto's thyroiditis Mother    Allergies Mother    Allergies Father    Lupus Neg Hx    COPD Neg Hx    Urticaria Neg Hx    Eczema Neg Hx     Asthma Neg Hx    Food Allergy Neg Hx    Bronchitis Neg Hx    Social History: Tanetta lives in a house but around 50 years ago, no water damage, hardwood floors, gas heating, central AC, indoor cats and rabbits, no roaches, using dust mite covers on the bed and the pillows.  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  + HEPA filter in the home.  [place]   ROS:  All other systems negative except as noted per HPI.  Objective:  Blood pressure 108/72, pulse 76, temperature 99.1 F (37.3 C), temperature source Oral, resp. rate 20, height 5' 7 (1.702 m), weight 233 lb (105.7 kg). Body mass index is 36.49 kg/m. Physical Exam:  General Appearance:  Alert, cooperative, no distress, appears stated age  Head:  Normocephalic, without obvious abnormality, atraumatic  Eyes:  Conjunctiva clear, EOM's intact  Ears EACs normal bilaterally and normal TMs bilaterally  Nose: Nares normal, hypertrophic turbinates, normal mucosa, and no visible anterior polyps  Throat: Lips, tongue normal; teeth and gums normal, normal posterior oropharynx  Neck: Supple, symmetrical  Lungs:   clear to auscultation bilaterally, Respirations unlabored, no coughing  Heart:  regular rate and rhythm and no murmur, Appears well perfused  Extremities: No edema  Skin: Skin color, texture, turgor normal and no rashes or lesions on visualized portions of skin  Neurologic: No gross deficits   Diagnostics:  Labs:  Lab Orders  No laboratory test(s) ordered today     Assessment and Plan   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 Discussed role of hypothyroidism and reflux in setting of rhinitis symptoms if these are uncontrolled.  Chronic Rhinitis:  Previous allergy testing reportedly positive to molds Plan to update testing at follow-up. -  Prevention:  - allergen avoidance when possible - consider allergy shots as long term control of your symptoms by teaching your immune system to be more tolerant of your allergy triggers - Symptom control: - Start Ryaltris  1-2 sprays in each nostril twice a day as needed for nasal congestion/itchy nose-this will go to a send out pharmacy in [place] called [facility] - Start Antihistamine: daily or daily as needed.   -Options include Zyrtec (Cetirizine) 10mg , Claritin (Loratadine) 10mg , Allegra (Fexofenadine) 180mg , or Xyzal (Levocetirinze) 5mg  - Can be purchased over-the-counter if not covered by insurance. - stop all decongestants as this can lead to rebound congestion and unwanted side effects like hypertension  GERD Continue omeprazole as prescribed. Let us  know if reflux becomes uncontrolled as this can contribute to rhinitis symptoms.  Follow up :August 26th at 9:30 AM (must stop antihistamines 3 days prior to visit) It was a pleasure meeting you in clinic today! Thank you  for allowing me to participate in your care.   This note in its entirety was forwarded to the Provider who requested this consultation.  Other: none  Thank you for your kind referral. I appreciate the opportunity to take part in Conner's care. Please do not hesitate to contact me with questions.  Sincerely,  Rocky Endow, MD [facility] 

## 2024-06-16 ENCOUNTER — Other Ambulatory Visit (HOSPITAL_COMMUNITY): Payer: Self-pay

## 2024-06-16 ENCOUNTER — Telehealth: Payer: Self-pay

## 2024-06-16 ENCOUNTER — Telehealth: Payer: Self-pay | Admitting: Internal Medicine

## 2024-06-16 NOTE — Telephone Encounter (Signed)
*  AA  Pharmacy Patient Advocate Encounter   Received notification from CoverMyMeds that prior authorization for Ryaltris  665-25MCG/ACT suspension  is required/requested.   Insurance verification completed.   The patient is insured through [facility] .   Per test claim: PA required; PA submitted to above mentioned insurance via CoverMyMeds Key/confirmation #/EOC [id number] Status is pending

## 2024-06-16 NOTE — Telephone Encounter (Signed)
 PA request has been Received. New Encounter has been or will be created for follow up. For additional info see Pharmacy Prior Auth telephone encounter from 08/04.

## 2024-06-27 NOTE — Telephone Encounter (Signed)
 Left message for patient at [phone] PA for ryaltryst denied is there other nasal sprays she has tried and failed?

## 2024-07-08 ENCOUNTER — Ambulatory Visit (INDEPENDENT_AMBULATORY_CARE_PROVIDER_SITE_OTHER): Admitting: Internal Medicine

## 2024-07-08 ENCOUNTER — Encounter: Payer: Self-pay | Admitting: Internal Medicine

## 2024-07-08 DIAGNOSIS — J310 Chronic rhinitis: Secondary | ICD-10-CM

## 2024-07-08 NOTE — Progress Notes (Signed)
 Date of Service/Encounter:  07/08/24  Allergy  testing appointment   Initial visit on 06/12/24, seen for chronic rhinitis.  Please see that note for additional details.  Today reports for allergy  diagnostic testing:    DIAGNOSTICS:  Skin Testing: Environmental allergy  panel. Adequate positive and negative controls. Results discussed with patient/family.  Airborne Adult Perc - 07/08/24 0931     Time Antigen Placed 0932    Allergen Manufacturer Jestine    Location Back    Number of Test 55    1. Control-Buffer 50% Glycerol Negative    2. Control-Histamine Negative    3. Bahia Negative    4. French Southern Territories Negative    5. Johnson Negative    6. [place]  Blue Negative    7. Meadow Fescue Negative    8. Perennial Rye Negative    9. Timothy Negative    10. Ragweed Mix Negative    11. Cocklebur Negative    12. Plantain,  English Negative    13. Baccharis Negative    14. Dog Fennel Negative    15. Russian Thistle Negative    16. Lamb's Quarters Negative    17. Sheep Sorrell Negative    18. Rough Pigweed Negative    19. Marsh Elder, Rough Negative    20. Mugwort, Common Negative    21. Box, Elder Negative    22. Cedar, red Negative    23. Sweet Gum Negative    24. Pecan Pollen Negative    25. Pine Mix Negative    26. Walnut, Black Pollen Negative    27. Red Mulberry Negative    28. Ash Mix Negative    29. Birch Mix Negative    30. Beech American Negative    31. Cottonwood, Guinea[place]au Negative    32. Hickory, White Negative    33. Maple Mix Negative    34. Oak, [place] Mix Negative    35. Sycamore Eastern Negative    36. Alternaria Alternata Negative    37. Cladosporium Herbarum Negative    38. Aspergillus Mix Negative    39. Penicillium Mix Negative    40. Bipolaris Sorokiniana (Helminthosporium) Negative    41. Drechslera Spicifera (Curvularia) Negative    42. Mucor Plumbeus Negative    43. Fusarium Moniliforme Negative    44. Aureobasidium Pullulans (pullulara) Negative     45. Rhizopus Oryzae Negative    46. Botrytis Cinera Negative    47. Epicoccum Nigrum Negative    48. Phoma Betae Negative    49. Dust Mite Mix Negative    50. Cat Hair 10,000 BAU/ml Negative    51.  Dog Epithelia Negative    52. Mixed Feathers Negative    53. Horse Epithelia Negative    54. Cockroach, German Negative    55. Tobacco Leaf Negative          Intradermal - 07/08/24 1032     Time Antigen Placed 0945    Allergen Manufacturer Jestine    Location Arm    Number of Test 16    Control Negative    Bahia Negative    French Southern Territories Negative    Johnson Negative    7 Grass Negative    Ragweed Mix Negative    Weed Mix Negative    Tree Mix Negative    Mold 1 Negative    Mold 2 2+    Mold 3 Negative    Mold 4 2+    Mite Mix Negative    Cat Negative    Dog Negative  Cockroach Negative          Allergy  testing results were read and interpreted by myself, documented by clinical staff.  Patient provided with copy of allergy  testing along with avoidance measures when indicated.   Rocky Endow, MD  [facility]    Patient did request to have decongestant abuse removed from her chart. I explained that decongestant abuse is the term used by ICD 10 coding, and that I could edit it to say decongestant use-which was completed during today's encounter. Abuse has such a negative connotation in our society, and agree this is poor word choice for this particular code.  I also removed it from her problem list, as she is no longer using decongestants. However, explained it is an important billing code and the use of decongestants does affect medical decision making since decongestants can lead to rhinitis medicamentosa.   --------------------------------------------------------------------------  Chronic Rhinitis: likely mixed with  Previous allergy  testing reportedly positive to molds Skin testing 07/08/24: negative on skin testing; IDs borderline to  indoor molds and minor molds, unclear clinical significance. Allergen avoidance. -Consider allergy  injections to reduce lifetime symptoms and need for medications by teaching your immune system to become tolerant of the environmental allergens you are allergic to  - Prevention:  - allergen avoidance when possible - consider allergy  shots as long term control of your symptoms by teaching your immune system to be more tolerant of your allergy  triggers - Symptom control: - Continue Ryaltris  1-2 sprays in each nostril twice a day as needed for nasal congestion/itchy nose-this will go to a send out pharmacy in [place] called [facility] - Continue Antihistamine: daily or daily as needed.   -Options include Zyrtec (Cetirizine) 10mg , Claritin (Loratadine) 10mg , Allegra (Fexofenadine) 180mg , or Xyzal (Levocetirinze) 5mg  - Can be purchased over-the-counter if not covered by insurance. - stop all decongestants as this can lead to rebound congestion and unwanted side effects like hypertension  GERD Continue omeprazole as prescribed. Let us  know if reflux becomes uncontrolled as this can contribute to rhinitis symptoms.  Follow up :3 months, sooner if needed It was a pleasure seeing you again in clinic today! Thank you for allowing me to participate in your care.

## 2024-07-08 NOTE — Patient Instructions (Addendum)
 Chronic Rhinitis: likely mixed with  Previous allergy  testing reportedly positive to molds Skin testing 07/08/24: negative on skin testing; IDs borderline to indoor molds and minor molds, unclear clinical significance. Allergen avoidance. -Consider allergy  injections to reduce lifetime symptoms and need for medications by teaching your immune system to become tolerant of the environmental allergens you are allergic to  - Prevention:  - allergen avoidance when possible - consider allergy  shots as long term control of your symptoms by teaching your immune system to be more tolerant of your allergy  triggers - Symptom control: - Continue Ryaltris  1-2 sprays in each nostril twice a day as needed for nasal congestion/itchy nose-this will go to a send out pharmacy in [place] called [facility] - Continue Antihistamine: daily or daily as needed.   -Options include Zyrtec (Cetirizine) 10mg , Claritin (Loratadine) 10mg , Allegra (Fexofenadine) 180mg , or Xyzal (Levocetirinze) 5mg  - Can be purchased over-the-counter if not covered by insurance. - stop all decongestants as this can lead to rebound congestion and unwanted side effects like hypertension  GERD Continue omeprazole as prescribed. Let us  know if reflux becomes uncontrolled as this can contribute to rhinitis symptoms.  Follow up :3 months, sooner if needed It was a pleasure seeing you again in clinic today! Thank you for allowing me to participate in your care.  Rocky Endow, MD [facility]

## 2024-07-09 NOTE — Telephone Encounter (Signed)
 Patient states she is not interested due to cost we can close PA request. She appreciates the call back

## 2024-07-27 ENCOUNTER — Other Ambulatory Visit: Payer: Self-pay | Admitting: Endocrinology

## 2024-07-27 DIAGNOSIS — E063 Autoimmune thyroiditis: Secondary | ICD-10-CM

## 2024-08-14 ENCOUNTER — Ambulatory Visit: Admitting: Endocrinology

## 2024-09-05 ENCOUNTER — Encounter: Payer: Self-pay | Admitting: Endocrinology

## 2024-09-05 ENCOUNTER — Other Ambulatory Visit

## 2024-09-05 ENCOUNTER — Ambulatory Visit: Admitting: Endocrinology

## 2024-09-05 VITALS — BP 122/80 | HR 82 | Resp 20 | Ht 67.0 in | Wt 241.6 lb

## 2024-09-05 DIAGNOSIS — E063 Autoimmune thyroiditis: Secondary | ICD-10-CM

## 2024-09-05 LAB — T4, FREE: Free T4: 1 ng/dL (ref 0.8–1.8)

## 2024-09-05 LAB — T3, FREE: T3, Free: 2.8 pg/mL (ref 2.3–4.2)

## 2024-09-05 LAB — TSH: TSH: 1.68 m[IU]/L

## 2024-09-05 NOTE — Progress Notes (Signed)
 Outpatient Endocrinology Note [place] Cynthia Raine, MD  09/05/24  Patient's Name: Cynthia Webb    DOB: Dec 28, 1980    MRN: [id number]  REASON OF VISIT: Follow-up for hypothyroidism  PCP: Pia Kerney SQUIBB, MD  HISTORY OF PRESENT ILLNESS:   [name] is a 43 y.o. old female with past medical history as listed below is presented for a follow up of hypothyroidism due to Hashimoto's thyroiditis.   Pertinent Thyroid  History: - Patient was diagnosed with hypothyroidism  in 2011.  At the time of diagnosis she was having symptoms of extreme fatigue, somnolence, feeling of brain fog.  Per note she mentioned that her symptoms resembled with hypothyroidism but test were normal no details regarding available to review.  Synthroid  was started by primary care provider because she was found to have antibody test for Hashimoto's disease.  She did not feel better on Synthroid  and switched to Armour Thyroid .  She somewhat felt better on Armour Thyroid .  She was having significant fatigue on Armour Thyroid  and changed to combination therapy with levothyroxine  and liothyronine  on initial consultation with endocrinology in March 2021.  She is currently taking levothyroxine  112 mcg daily and liothyronine  5 mcg daily.    Interval history  Patient has been taking levothyroxine  112 mcg daily and liothyronine  5 mcg daily, taking in the morning and reports compliance.  She denies palpitation and heat intolerance.  She has complaints of low energy, hair loss and nail changes.  She also has iron deficiency anemia discussed that nail changes and hair loss can also be related to iron deficiency.  She has not been taking iron supplement regularly.  Discussed that is important to take iron supplement daily advised to take iron supplement at bedtime and continue to take her thyroid  medication in the morning and empty stomach.  No other complaints today.  REVIEW OF SYSTEMS:  As per history of present illness.   PAST MEDICAL  HISTORY: Past Medical History:  Diagnosis Date   Anxiety    Complication of anesthesia    panic when wakes up    Depression    Hyperlipidemia    Hypothyroidism     PAST SURGICAL HISTORY: Past Surgical History:  Procedure Laterality Date   KNEE SURGERY     LAPAROSCOPIC GASTRIC BANDING WITH HIATAL HERNIA REPAIR  08/17/2015   Procedure: LAPAROSCOPIC GASTRIC BANDING WITH HIATAL HERNIA REPAIR;  Surgeon: Camellia Blush, MD;  Location: [facility];  Service: General;;    ALLERGIES: Allergies  Allergen Reactions   Hydrocodone-Acetaminophen  Other (See Comments)    Other reaction(s): Unknown   Semaglutide Nausea And Vomiting and Nausea Only    Other reaction(s): nausea and vomiting    FAMILY HISTORY:  Family History  Problem Relation Age of Onset   Diabetes Mother    Hashimoto's thyroiditis Mother    Allergies Mother    Allergies Father    Lupus Neg Hx    COPD Neg Hx    Urticaria Neg Hx    Eczema Neg Hx    Asthma Neg Hx    Food Allergy  Neg Hx    Bronchitis Neg Hx     SOCIAL HISTORY: Social History   Socioeconomic History   Marital status: Single    Spouse name: Not on file   Number of children: Not on file   Years of education: Not on file   Highest education level: Not on file  Occupational History   Not on file  Tobacco Use   Smoking status: Never   Smokeless  tobacco: Never  Substance and Sexual Activity   Alcohol use: Yes    Comment: rare   Drug use: No   Sexual activity: Not on file  Other Topics Concern   Not on file  Social History Narrative   Not on file   Social Drivers of Health   Financial Resource Strain: Not on file  Food Insecurity: Not on file  Transportation Needs: Not on file  Physical Activity: Not on file  Stress: Not on file  Social Connections: Not on file    MEDICATIONS:  Current Outpatient Medications  Medication Sig Dispense Refill   ALPRAZolam (XANAX) 1 MG tablet Take 1 mg by mouth at bedtime as needed for anxiety.      butalbital-acetaminophen -caffeine (FIORICET, ESGIC) 50-325-40 MG per tablet Take 1 tablet by mouth every 4 (four) hours as needed for headache or migraine.     ergocalciferol (VITAMIN D2) 50000 UNITS capsule Take 50,000 Units by mouth 2 (two) times a week.     escitalopram  (LEXAPRO ) 10 MG tablet Take 1 tablet (10 mg total) by mouth daily. 30 tablet 2   ezetimibe (ZETIA) 10 MG tablet Take 10 mg by mouth daily.     levothyroxine  (SYNTHROID ) 112 MCG tablet Take 1 tablet by mouth daily. 90 tablet 1   liothyronine  (CYTOMEL ) 5 MCG tablet Take 1 tablet by mouth once daily on an empty stomach. 90 tablet 1   nadolol (CORGARD) 20 MG tablet Take by mouth.     pyridoxine (B-6) 100 MG tablet Take 100 mg by mouth daily.     SUMAtriptan (IMITREX) 100 MG tablet Take by mouth.     vitamin B-12 (CYANOCOBALAMIN) 500 MCG tablet Take 500 mcg by mouth daily.     Vitamin D, Ergocalciferol, (DRISDOL) 1.25 MG (50000 UNIT) CAPS capsule      No current facility-administered medications for this visit.    PHYSICAL EXAM: Vitals:   09/05/24 1001  BP: 122/80  Pulse: 82  Resp: 20  SpO2: 99%  Weight: 241 lb 9.6 oz (109.6 kg)  Height: 5' 7 (1.702 m)    Body mass index is 37.84 kg/m.  Wt Readings from Last 3 Encounters:  09/05/24 241 lb 9.6 oz (109.6 kg)  06/12/24 233 lb (105.7 kg)  02/14/24 234 lb 12.8 oz (106.5 kg)     General: Well developed, well nourished female in no apparent distress.  HEENT: AT/[place], no external lesions. Hearing intact to the spoken word Eyes: Conjunctiva clear and no icterus. Neck: Trachea midline, neck supple without appreciable thyromegaly or lymphadenopathy and no palpable thyroid  nodules Lungs: Clear to auscultation, no wheeze. Respirations not labored Heart: S1S2, Regular in rate and rhythm. Abdomen: Soft, non tender, non distended Neurologic: Alert, oriented, normal speech, deep tendon biceps reflexes normal Extremities: No pedal pitting edema, no tremors of outstretched  hands Skin: Warm, color good.  Psychiatric: Does not appear depressed or anxious  PERTINENT HISTORIC LABORATORY AND IMAGING STUDIES:  All pertinent laboratory results were reviewed. Please see HPI also for further details.   TSH  Date Value Ref Range Status  02/14/2024 1.20 mIU/L Final    Comment:              Reference Range .           > or = 20 Years  0.40-4.50 .                Pregnancy Ranges           First trimester  0.26-2.66           Second trimester   0.55-2.73           Third trimester    0.43-2.91   08/16/2023 0.33 (L) 0.35 - 5.50 uIU/mL Final  01/22/2023 0.24 (L) 0.35 - 5.50 uIU/mL Final     ASSESSMENT / PLAN  1. Hypothyroidism due to Hashimoto's thyroiditis    -Patient has hypothyroidism due to Hashimoto's thyroiditis. -She is currently taking levothyroxine  112 mcg daily and liothyronine  5 mcg daily. - Discussed that fatigue can be multifactorial advised to take iron supplement as well.  Consider IV iron supplement.  Advised to discuss with primary care provider.  Patient is asked to take iron supplement at bedtime and continue to take thyroid  medications in the early morning fasting.  Plan: -Check thyroid  function test TSH, free T4, free T3.  And adjust dose as needed. -Endocrinology follow-up every 6 months for now.  Diagnoses and all orders for this visit:  Hypothyroidism due to Hashimoto's thyroiditis -     T4, free -     T3, free -     TSH   DISPOSITION Follow up in clinic in 6 months suggested.  Labs on the same day of the visit.  All questions answered and patient verbalized understanding of the plan.  [place] Rosha Cocker, MD [facility] Endocrinology [facility] [address] [place], [address] [place], [place] [address] Phone # [phone]  At least part of this note was generated using voice recognition software. Inadvertent word errors may have occurred, which were not recognized during the proofreading process.

## 2024-09-08 ENCOUNTER — Other Ambulatory Visit: Payer: Self-pay | Admitting: Endocrinology

## 2024-09-08 ENCOUNTER — Ambulatory Visit: Payer: Self-pay | Admitting: Endocrinology

## 2024-09-08 DIAGNOSIS — E063 Autoimmune thyroiditis: Secondary | ICD-10-CM

## 2024-09-08 MED ORDER — LIOTHYRONINE SODIUM 5 MCG PO TABS: 5.0000 ug | ORAL_TABLET | Freq: Every day | ORAL | 3 refills | Status: AC

## 2024-09-08 MED ORDER — LEVOTHYROXINE SODIUM 112 MCG PO TABS: 112.0000 ug | ORAL_TABLET | Freq: Every day | ORAL | 3 refills | Status: AC

## 2025-03-10 ENCOUNTER — Ambulatory Visit: Admitting: Endocrinology
